# Patient Record
Sex: Male | Born: 1960
Health system: Southern US, Community
[De-identification: ages and names within clinical notes are randomized; demographics above are authoritative.]

## PROBLEM LIST (undated history)

## (undated) DIAGNOSIS — G4733 Obstructive sleep apnea (adult) (pediatric): Principal | ICD-10-CM

## (undated) DIAGNOSIS — I480 Paroxysmal atrial fibrillation: Secondary | ICD-10-CM

## (undated) DIAGNOSIS — I10 Essential (primary) hypertension: Secondary | ICD-10-CM

## (undated) DIAGNOSIS — M25569 Pain in unspecified knee: Secondary | ICD-10-CM

## (undated) DIAGNOSIS — Z87898 Personal history of other specified conditions: Secondary | ICD-10-CM

## (undated) DIAGNOSIS — R0602 Shortness of breath: Secondary | ICD-10-CM

## (undated) DIAGNOSIS — Z8739 Personal history of other diseases of the musculoskeletal system and connective tissue: Secondary | ICD-10-CM

## (undated) DIAGNOSIS — M25519 Pain in unspecified shoulder: Secondary | ICD-10-CM

## (undated) DIAGNOSIS — Z9989 Dependence on other enabling machines and devices: Principal | ICD-10-CM

## (undated) DIAGNOSIS — I4891 Unspecified atrial fibrillation: Secondary | ICD-10-CM

## (undated) DIAGNOSIS — R002 Palpitations: Secondary | ICD-10-CM

## (undated) HISTORY — DX: Shortness of breath: R06.02

## (undated) HISTORY — DX: Personal history of other diseases of the musculoskeletal system and connective tissue: Z87.39

## (undated) HISTORY — DX: Pain in unspecified knee: M25.569

## (undated) HISTORY — DX: Morbid (severe) obesity due to excess calories: E66.01

## (undated) HISTORY — DX: Palpitations: R00.2

## (undated) HISTORY — DX: Pain in unspecified shoulder: M25.519

## (undated) HISTORY — DX: Dependence on other enabling machines and devices: Z99.89

## (undated) HISTORY — DX: Unspecified atrial fibrillation: I48.91

## (undated) HISTORY — DX: Paroxysmal atrial fibrillation: I48.0

## (undated) HISTORY — DX: Obstructive sleep apnea (adult) (pediatric): G47.33

## (undated) HISTORY — DX: Personal history of other specified conditions: Z87.898

---

## 1998-05-19 ENCOUNTER — Encounter: Payer: Self-pay | Admitting: Emergency Medicine

## 1998-05-19 ENCOUNTER — Emergency Department (HOSPITAL_COMMUNITY): Admission: EM | Admit: 1998-05-19 | Discharge: 1998-05-19 | Payer: Self-pay | Admitting: Emergency Medicine

## 2009-09-09 ENCOUNTER — Ambulatory Visit: Payer: Self-pay | Admitting: Sports Medicine

## 2009-09-09 DIAGNOSIS — M25569 Pain in unspecified knee: Secondary | ICD-10-CM | POA: Insufficient documentation

## 2009-09-09 HISTORY — DX: Pain in unspecified knee: M25.569

## 2009-09-23 ENCOUNTER — Encounter: Admission: RE | Admit: 2009-09-23 | Discharge: 2009-09-23 | Payer: Self-pay | Admitting: Sports Medicine

## 2010-10-07 NOTE — Assessment & Plan Note (Signed)
Summary: RT KNEE PAIN,MC   Vital Signs:  Patient profile:   50 year old male Height:      71 inches Weight:      280 pounds BMI:     39.19 BP sitting:   148 / 102  Vitals Entered By: Lillia Pauls CMA (September 09, 2009 11:30 AM)  History of Present Illness: Pt presents today with complaint of Right Medial Knee pain.  He states he injuried this knee 30 years ago in a football injury that occured from a lateral blow to the knee.  He is unsure of the exact injury at that time but attributes the pain he is currently having this event.  Currently his knee has been bothering him on and off for approximatly 18 months ever since he was [place] the [place] with his [place].  He describes the pain as an aching pain located along the medial joint line.  He reports one episode of giving out during a basketball game this summer; no locking, clicking or grinding.  He has tried asprin and Tylenol with no relief and has had no formal PT but reports using an exercise bike and a regular routine of weight lifting. Currently he has limited only his wt bearing exercise but has had no impact on his day to day life.  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   Msk:  R knee:  no joint swelling, no joint warmth, no joint deformities, no joint instability, and no crepitation.  ROM: limited by 10 degrees of flexion Tenderness with lateral stress along the medial joint line; no instability.    stable ligaments; negative Mcmurray's and provocative meniscal tests; non painful patellar compression; patellar and quadriceps tendons unremarkable.  neg thessaly neg flex pinch    Impression & Recommendations:  Problem # 1:  KNEE PAIN ([id number]-469.62)  Orders: Radiology other (Radiology Other)  RT knee meidal joint is tender enough to suspect some OCD or  possible early med compt DJD  will ck standing knee films and compare no clinical sign to suggest meniscus is primary but may have some degenerative meniscal change  use arch supports to lessen medial stress consider tramadol in future if pain severe trial on devils claw  consider don joy support if not better with cons care cont on biking for rehab  reck in 2 mos  Complete Medication List: 1)  Clonazepam 1 Mg Tabs (Clonazepam) .Marland Kitchen.. 1 mg by mouth at bedtime

## 2012-03-02 ENCOUNTER — Encounter (HOSPITAL_COMMUNITY): Payer: Self-pay | Admitting: Adult Health

## 2012-03-02 ENCOUNTER — Emergency Department (HOSPITAL_COMMUNITY)
Admission: EM | Admit: 2012-03-02 | Discharge: 2012-03-02 | Disposition: A | Discharge status: Home / Self Care | Payer: BC Managed Care – PPO | Attending: Emergency Medicine | Admitting: Emergency Medicine

## 2012-03-02 ENCOUNTER — Emergency Department (HOSPITAL_COMMUNITY): Payer: BC Managed Care – PPO

## 2012-03-02 DIAGNOSIS — I10 Essential (primary) hypertension: Secondary | ICD-10-CM | POA: Insufficient documentation

## 2012-03-02 DIAGNOSIS — Y9301 Activity, walking, marching and hiking: Secondary | ICD-10-CM | POA: Insufficient documentation

## 2012-03-02 DIAGNOSIS — Y998 Other external cause status: Secondary | ICD-10-CM | POA: Insufficient documentation

## 2012-03-02 DIAGNOSIS — W01119A Fall on same level from slipping, tripping and stumbling with subsequent striking against unspecified sharp object, initial encounter: Secondary | ICD-10-CM | POA: Insufficient documentation

## 2012-03-02 DIAGNOSIS — W268XXA Contact with other sharp object(s), not elsewhere classified, initial encounter: Secondary | ICD-10-CM | POA: Insufficient documentation

## 2012-03-02 DIAGNOSIS — S81819A Laceration without foreign body, unspecified lower leg, initial encounter: Secondary | ICD-10-CM

## 2012-03-02 DIAGNOSIS — S81009A Unspecified open wound, unspecified knee, initial encounter: Secondary | ICD-10-CM | POA: Insufficient documentation

## 2012-03-02 HISTORY — DX: Essential (primary) hypertension: I10

## 2012-03-02 MED ORDER — "THROMBI-PAD 3""X3"" EX PADS"
MEDICATED_PAD | CUTANEOUS | Status: AC
  Filled 2012-03-02: qty 1

## 2012-03-02 MED ORDER — TETANUS-DIPHTH-ACELL PERTUSSIS 5-2.5-18.5 LF-MCG/0.5 IM SUSP
0.5000 mL | Freq: Once | INTRAMUSCULAR | Status: AC
  Administered 2012-03-02: 0.5 mL via INTRAMUSCULAR
  Filled 2012-03-02: qty 0.5

## 2012-03-02 NOTE — ED Notes (Addendum)
Reports flag pole that broke and cut into left lower leg. Pt states it happened earlier this evening at 9pm and it has not stopped bleeding since. Pressure applied to wound. CMS intact.

## 2012-03-02 NOTE — ED Notes (Signed)
Pt discharged home without any bleeding present with spouse.

## 2012-03-02 NOTE — ED Notes (Signed)
Suture cart placed at bedside. 

## 2012-03-02 NOTE — ED Provider Notes (Signed)
History     CSN: [id number]  Arrival date & time 03/02/12  0150   First MD Initiated Contact with Patient 03/02/12 0226      Chief Complaint  Patient presents with  . Laceration    (Consider location/radiation/quality/duration/timing/severity/associated sxs/prior treatment) Patient is a 51 y.o. male presenting with skin laceration. The history is provided by the patient.  Laceration  The incident occurred 6 to 12 hours ago.   patient states that he was carrying some heavy bags and fell. He grabbed on to a metal flagpole the broken cut into his left lower leg. States that he has not been able to have the bleeding stopped. No other injury. No numbness weakness. His last tetanus was in the 80s. No numbness or weakness.  Past Medical History  Diagnosis Date  . Hypertension     No past surgical history on file.  No family history on file.  History  Substance Use Topics  . Smoking status: Never Smoker   . Smokeless tobacco: Not on file  . Alcohol Use: No      Review of Systems  Musculoskeletal: Negative for myalgias.  Skin: Positive for wound.  Neurological: Negative for weakness and numbness.    Allergies  Review of patient's allergies indicates no known allergies.  Home Medications   Current Outpatient Rx  Name Route Sig Dispense Refill  . CLONAZEPAM 2 MG PO TABS Oral Take 2 mg by mouth at bedtime.    Marland Kitchen LOSARTAN POTASSIUM 100 MG PO TABS Oral Take 100 mg by mouth daily.      BP 135/102  Pulse 83  Temp 97.9 F (36.6 C) (Oral)  Resp 16  SpO2 97%  Physical Exam  Constitutional: He appears well-developed and well-nourished.  Musculoskeletal:       4 cm U-shaped laceration to left lower anterior leg. It is lateral to the tibia on the lower third of the lower leg. Neurovascular intact distally. Strong dorsalis pedis pulse. Sensation intact over her foot.  Neurological: He is alert.  Skin: Skin is warm.    ED Course  Procedures (including critical care  time)  Labs Reviewed - No data to display Dg Tibia/fibula Left  03/02/2012  *RADIOLOGY REPORT*  Clinical Data: Laceration to the anterior distal lower leg with metal object.  LEFT TIBIA AND FIBULA - 2 VIEW  Comparison: Left knee 09/23/2009  Findings: Mild degenerative changes in the left knee.  Focal skin defect consistent with laceration and avulsion anterior to the distal tibia.  Multiple tiny radiopaque densities are demonstrated over the area of soft tissue injury.  The appearance is more likely to represent punctate calcification or debris.  No metallic foreign bodies are demonstrated.  Overlying gauze material was present. Underlying bones appear intact.  No evidence of acute fracture or subluxation.  No focal bone lesion or bone destruction.  No abnormal periosteal reaction.  IMPRESSION: Soft tissue avulsion anterior to the distal tibia.  Multiple punctate radiopaque densities in the region of the soft tissue defect most consistent with debris or calcification.  No metallic foreign bodies are demonstrated.  No evidence of osteomyelitis or fracture.  Original Report Authenticated By: Marlon Pel, M.D.     1. Laceration of lower leg     LACERATION REPAIR Performed by: Billee Cashing Authorized by: Billee Cashing Consent: Verbal consent obtained. Risks and benefits: risks, benefits and alternatives were discussed Consent given by: patient Patient identity confirmed: provided demographic data Prepped and Draped in normal sterile fashion Wound explored  Laceration Location: left lower leg  Laceration Length: 4cm  Small foreign bodies seen and removed with skin scrub  Anesthesia: local infiltration  Local anesthetic: lidocaine 2% with epinephrine  Anesthetic total: 5 ml  Irrigation method: syringe Amount of cleaning: standard  Skin closure: 4-0 prolene  Number of sutures: 9  Technique: simple interupted  Patient tolerance: Patient tolerated the procedure  well with no immediate complications.  MDM  Laceration to left lower leg. Foreign bodies seen or removed. Sutured. Patient was discharged home.         Juliet Rude. Rubin Payor, MD 03/02/12 [phone]

## 2012-03-02 NOTE — Discharge Instructions (Signed)

## 2017-04-27 ENCOUNTER — Other Ambulatory Visit: Payer: Self-pay | Admitting: Internal Medicine

## 2017-04-27 DIAGNOSIS — R0600 Dyspnea, unspecified: Secondary | ICD-10-CM

## 2017-04-28 ENCOUNTER — Encounter (HOSPITAL_COMMUNITY): Payer: Self-pay | Admitting: Nurse Practitioner

## 2017-04-28 ENCOUNTER — Ambulatory Visit (HOSPITAL_COMMUNITY)
Admission: RE | Admit: 2017-04-28 | Discharge: 2017-04-28 | Disposition: A | Discharge status: Home / Self Care | Payer: Managed Care, Other (non HMO) | Source: Ambulatory Visit | Attending: Nurse Practitioner | Admitting: Nurse Practitioner

## 2017-04-28 ENCOUNTER — Other Ambulatory Visit: Payer: Self-pay

## 2017-04-28 ENCOUNTER — Ambulatory Visit: Payer: Managed Care, Other (non HMO)

## 2017-04-28 VITALS — BP 136/74 | HR 161 | Ht 71.0 in | Wt 317.0 lb

## 2017-04-28 DIAGNOSIS — Z7289 Other problems related to lifestyle: Secondary | ICD-10-CM | POA: Insufficient documentation

## 2017-04-28 DIAGNOSIS — I10 Essential (primary) hypertension: Secondary | ICD-10-CM | POA: Insufficient documentation

## 2017-04-28 DIAGNOSIS — Z79899 Other long term (current) drug therapy: Secondary | ICD-10-CM | POA: Insufficient documentation

## 2017-04-28 DIAGNOSIS — I4891 Unspecified atrial fibrillation: Secondary | ICD-10-CM | POA: Diagnosis not present

## 2017-04-28 DIAGNOSIS — I480 Paroxysmal atrial fibrillation: Secondary | ICD-10-CM | POA: Diagnosis not present

## 2017-04-28 MED ORDER — DILTIAZEM HCL ER COATED BEADS 240 MG PO CP24: 240.0000 mg | ORAL_CAPSULE | Freq: Every day | ORAL | 3 refills | Status: DC

## 2017-04-28 NOTE — Progress Notes (Signed)
Primary Care Physician: Lorenda Ishihara, MD Referring Physician: Dr. Rexanne Mano [place] is a 56 y.o. male with a h/o morbid obesity, HTN that presented to the [facility] office for a stress test and was found to be in afib at 160 bpm. His PCP had ordered because the pt had noted intermittent shortness of breath. He was aware of a fast heart today rate but stable otherwise. He was asked to  come to afib clinic for further treatment. He remains in afib at 160 bpm currently. Chadsvasc score of 1 for HTN.  He reports that he lost his job at [employer] this past May. This has caused a lot of stress. Because he is sitting more at the computer looking for a job, he has gained 25 lbs. He does snore heavily. No tobacco, or alcohol, mod caffeine use. He will lose his insurance 8/31. He had an echo/stress test years ago.  Today, he denies symptoms of  chest pain, shortness of breath, orthopnea, PND, lower extremity edema, dizziness, presyncope, syncope, or neurologic sequela. The patient is tolerating medications without difficulties and is otherwise without complaint today.   Past Medical History:  Diagnosis Date  . Hypertension    No past surgical history on file.  Current Outpatient Prescriptions  Medication Sig Dispense Refill  . clonazePAM (KLONOPIN) 2 MG tablet Take 2 mg by mouth at bedtime.    Marland Kitchen losartan (COZAAR) 100 MG tablet Take 100 mg by mouth daily.    . niacin 500 MG tablet Take 500 mg by mouth at bedtime.    Marland Kitchen testosterone cypionate (DEPOTESTOSTERONE CYPIONATE) 200 MG/ML injection Inject into the muscle every 14 (fourteen) days. .75 cc    . diltiazem (CARTIA XT) 240 MG 24 hr capsule Take 1 capsule (240 mg total) by mouth daily. 30 capsule 3   No current facility-administered medications for this encounter.     No Known Allergies  Social History   Social History  . Marital status: Married    Spouse name: N/A  . Number of children: N/A  . Years of  education: N/A   Occupational History  . Not on file.   Social History Main Topics  . Smoking status: Never Smoker  . Smokeless tobacco: Never Used  . Alcohol use No  . Drug use: Unknown  . Sexual activity: Not on file   Other Topics Concern  . Not on file   Social History Narrative  . No narrative on file    No family history on file.  ROS- All systems are reviewed and negative except as per the HPI above  Physical Exam: Vitals:   04/28/17 1402  BP: 136/74  Pulse: (!) 161  Weight: (!) 317 lb (143.8 kg)  Height: 5\' 11"  (1.803 m)   Wt Readings from Last 3 Encounters:  04/28/17 (!) 317 lb (143.8 kg)  09/09/09 (!) 280 lb (127 kg)    Labs: No results found for: NA, K, CL, CO2, GLUCOSE, BUN, CREATININE, CALCIUM, PHOS, MG No results found for: INR No results found for: CHOL, HDL, LDLCALC, TRIG   GEN- The patient is well appearing, alert and oriented x 3 today.   Head- normocephalic, atraumatic Eyes-  Sclera clear, conjunctiva pink Ears- hearing intact Oropharynx- clear Neck- supple, no JVP Lymph- no cervical lymphadenopathy Lungs- Clear to ausculation bilaterally, normal work of breathing Heart- Rapid irregular rate and rhythm, no murmurs, rubs or gallops, PMI not laterally displaced GI- soft, NT, ND, + BS Extremities-  no clubbing, cyanosis, or edema MS- no significant deformity or atrophy Skin- no rash or lesion Psych- euthymic mood, full affect Neuro- strength and sensation are intact  EKG-afib with rvr at 161 bpm, LAFB qrs int 80 ms, qtc 454 ms Epic records reviewed    Assessment and Plan: 1. Paroxysmal afib with rvr General education re afib He will stop amlodipine and add Cardizem 240 mg daily Current chadsvasc score of 1, will not start anticoagulation at this time, unless afib proves to be persistent  2. Lifestyle issues Probable sleep apnea Needs a sleep study but will not order at this  time as it can not be done before 8/31 when pt loses  insurance He needs to work on weight loss as his obesity may be driving sleep apnea as well as afib Moderate caffeine use, cut consumption in half   F/u Friday am and if is back in rhythm, can reschedule stress test, probably needs repeat echo as well  Lupita Leash C. [facility] [address] [place], [place] [address] [phone]

## 2017-04-28 NOTE — Patient Instructions (Signed)
STOP amlodipine START diltiazem 240 mg, take one capsule once a day  We will see you Friday

## 2017-04-30 ENCOUNTER — Other Ambulatory Visit: Payer: Self-pay

## 2017-04-30 ENCOUNTER — Ambulatory Visit (HOSPITAL_COMMUNITY)
Admission: RE | Admit: 2017-04-30 | Discharge: 2017-04-30 | Disposition: A | Discharge status: Home / Self Care | Payer: Managed Care, Other (non HMO) | Source: Ambulatory Visit | Attending: Nurse Practitioner | Admitting: Nurse Practitioner

## 2017-04-30 ENCOUNTER — Encounter (HOSPITAL_COMMUNITY): Payer: Self-pay | Admitting: Nurse Practitioner

## 2017-04-30 VITALS — BP 136/84 | HR 99 | Ht 71.0 in | Wt 313.8 lb

## 2017-04-30 DIAGNOSIS — R000 Tachycardia, unspecified: Secondary | ICD-10-CM | POA: Insufficient documentation

## 2017-04-30 DIAGNOSIS — I480 Paroxysmal atrial fibrillation: Secondary | ICD-10-CM | POA: Insufficient documentation

## 2017-04-30 DIAGNOSIS — I10 Essential (primary) hypertension: Secondary | ICD-10-CM | POA: Insufficient documentation

## 2017-04-30 DIAGNOSIS — R0602 Shortness of breath: Secondary | ICD-10-CM | POA: Insufficient documentation

## 2017-04-30 DIAGNOSIS — I491 Atrial premature depolarization: Secondary | ICD-10-CM | POA: Diagnosis not present

## 2017-04-30 DIAGNOSIS — I444 Left anterior fascicular block: Secondary | ICD-10-CM | POA: Insufficient documentation

## 2017-04-30 DIAGNOSIS — I517 Cardiomegaly: Secondary | ICD-10-CM | POA: Diagnosis not present

## 2017-04-30 NOTE — Patient Instructions (Signed)
Your echo is scheduled for Monday 05/03/2017  At 11:30 at [address]  [facility] 3rd floor.  Please arrive 15 mins prior to this appointment.   Your lexiscan stress test, Part 1 is scheduled for 05/03/2017 at 12:30.  NO Caffeine 12 hours prior to the test and nothing to eat or drink 3 hours prior to.  Your lexiscan stress test, Part 2 is scheduled for Wednsday 05/05/2017 at 12:45.  NO caffeine 12 hours prior to and nothing to eat or drink 3 hours prior to.    The staff at the [address] office will be contacting you with further instructions  You are to continue your cardizem the day of testing

## 2017-04-30 NOTE — Progress Notes (Signed)
Primary Care Physician: Lorenda Ishihara, MD Referring Physician: Dr. Rexanne Mano [place] is a 56 y.o. male with a h/o morbid obesity, HTN that presented to the [facility] office for a stress test and was found to be in afib at 160 bpm. His PCP had ordered because the pt had noted intermittent shortness of breath. He was aware of a fast heart today rate but stable otherwise. He was asked to  come to afib clinic for further treatment. He remains in afib at 160 bpm currently. Chadsvasc score of 1 for HTN.  He reports that he lost his job at [employer] this past May. This has caused a lot of stress. Because he is sitting more at the computer looking for a job, he has gained 25 lbs. He does snore heavily. No tobacco, or alcohol, mod caffeine use. He will lose his insurance 8/31. He had an echo/stress test years ago.  F/u's in afib clinic, 8/24, and he is back in SR with PAC's. He is tolerating Cardizem 240 mg qd.  That was started on Wednesday. He will be rescheduled for a stress test, but will need a lexi Myoview with diltiazem on board so as not to induce afib again. I will also add an echo, now with known afib.  Today, he denies symptoms of  chest pain, shortness of breath, orthopnea, PND, lower extremity edema, dizziness, presyncope, syncope, or neurologic sequela. The patient is tolerating medications without difficulties and is otherwise without complaint today.   Past Medical History:  Diagnosis Date  . Hypertension    No past surgical history on file.  Current Outpatient Prescriptions  Medication Sig Dispense Refill  . clonazePAM (KLONOPIN) 2 MG tablet Take 2 mg by mouth at bedtime.    Marland Kitchen diltiazem (CARTIA XT) 240 MG 24 hr capsule Take 1 capsule (240 mg total) by mouth daily. 30 capsule 3  . losartan (COZAAR) 100 MG tablet Take 100 mg by mouth daily.    . niacin 500 MG tablet Take 500 mg by mouth at bedtime.    Marland Kitchen testosterone cypionate (DEPOTESTOSTERONE  CYPIONATE) 200 MG/ML injection Inject into the muscle every 14 (fourteen) days. .75 cc     No current facility-administered medications for this encounter.     No Known Allergies  Social History   Social History  . Marital status: Married    Spouse name: N/A  . Number of children: N/A  . Years of education: N/A   Occupational History  . Not on file.   Social History Main Topics  . Smoking status: Never Smoker  . Smokeless tobacco: Never Used  . Alcohol use No  . Drug use: Unknown  . Sexual activity: Not on file   Other Topics Concern  . Not on file   Social History Narrative  . No narrative on file    No family history on file.  ROS- All systems are reviewed and negative except as per the HPI above  Physical Exam: Vitals:   04/30/17 1020  BP: 136/84  Pulse: 99  Weight: (!) 313 lb 12.8 oz (142.3 kg)  Height: 5\' 11"  (1.803 m)   Wt Readings from Last 3 Encounters:  04/30/17 (!) 313 lb 12.8 oz (142.3 kg)  04/28/17 (!) 317 lb (143.8 kg)  09/09/09 (!) 280 lb (127 kg)    Labs: No results found for: NA, K, CL, CO2, GLUCOSE, BUN, CREATININE, CALCIUM, PHOS, MG No results found for: INR No results found for: CHOL,  HDL, LDLCALC, TRIG   GEN- The patient is well appearing, alert and oriented x 3 today.   Head- normocephalic, atraumatic Eyes-  Sclera clear, conjunctiva pink Ears- hearing intact Oropharynx- clear Neck- supple, no JVP Lymph- no cervical lymphadenopathy Lungs- Clear to ausculation bilaterally, normal work of breathing Heart- Irregular(pac's) rate and rhythm, no murmurs, rubs or gallops, PMI not laterally displaced GI- soft, NT, ND, + BS Extremities- no clubbing, cyanosis, or edema MS- no significant deformity or atrophy Skin- no rash or lesion Psych- euthymic mood, full affect Neuro- strength and sensation are intact  EKG-SR with PAC's at 99 bpm, pr int 176 ms, qrs int 86 ms, qtc 459 ms Epic records reviewed    Assessment and Plan: 1.  Paroxysmal afib with rvr Now back in SR General education re afib Continue Cardizem 240 mg daily Current chadsvasc score of 1, will not start anticoagulation at this time, unless afib proves to be persistent  2. Lifestyle issues Probable sleep apnea Needs a sleep study but will not order at this  time as it can not be done before 8/31 when pt loses insurance He needs to work on weight loss as his obesity may be driving sleep apnea as well as afib Moderate caffeine use, cut consumption in half   Will reschedule stress test 2 day lexi with meds and will need echo as well  Lupita Leash C. [facility] [address] [place], [place] [address] [phone]

## 2017-05-03 ENCOUNTER — Ambulatory Visit (HOSPITAL_BASED_OUTPATIENT_CLINIC_OR_DEPARTMENT_OTHER): Payer: Managed Care, Other (non HMO)

## 2017-05-03 ENCOUNTER — Ambulatory Visit (HOSPITAL_COMMUNITY): Payer: Managed Care, Other (non HMO) | Attending: Cardiology

## 2017-05-03 ENCOUNTER — Other Ambulatory Visit: Payer: Self-pay

## 2017-05-03 DIAGNOSIS — I358 Other nonrheumatic aortic valve disorders: Secondary | ICD-10-CM | POA: Diagnosis not present

## 2017-05-03 DIAGNOSIS — R0602 Shortness of breath: Secondary | ICD-10-CM

## 2017-05-03 DIAGNOSIS — I480 Paroxysmal atrial fibrillation: Secondary | ICD-10-CM | POA: Insufficient documentation

## 2017-05-03 DIAGNOSIS — I119 Hypertensive heart disease without heart failure: Secondary | ICD-10-CM | POA: Insufficient documentation

## 2017-05-03 DIAGNOSIS — R079 Chest pain, unspecified: Secondary | ICD-10-CM | POA: Insufficient documentation

## 2017-05-03 DIAGNOSIS — I2510 Atherosclerotic heart disease of native coronary artery without angina pectoris: Secondary | ICD-10-CM | POA: Insufficient documentation

## 2017-05-03 MED ORDER — REGADENOSON 0.4 MG/5ML IV SOLN
0.4000 mg | Freq: Once | INTRAVENOUS | Status: AC
  Administered 2017-05-03: 0.4 mg via INTRAVENOUS

## 2017-05-03 MED ORDER — TECHNETIUM TC 99M TETROFOSMIN IV KIT
32.9000 | PACK | Freq: Once | INTRAVENOUS | Status: AC | PRN
  Administered 2017-05-03: 32.9 via INTRAVENOUS
  Filled 2017-05-03: qty 33

## 2017-05-04 ENCOUNTER — Other Ambulatory Visit (HOSPITAL_COMMUNITY): Payer: Self-pay | Admitting: *Deleted

## 2017-05-04 DIAGNOSIS — I8290 Acute embolism and thrombosis of unspecified vein: Secondary | ICD-10-CM

## 2017-05-04 MED ORDER — RIVAROXABAN 20 MG PO TABS: 20.0000 mg | ORAL_TABLET | Freq: Every day | ORAL | 0 refills | Status: DC

## 2017-05-05 ENCOUNTER — Ambulatory Visit (HOSPITAL_COMMUNITY): Payer: Managed Care, Other (non HMO)

## 2017-05-05 LAB — MYOCARDIAL PERFUSION IMAGING
LV dias vol: 123 mL (ref 62–150)
LV sys vol: 80 mL
Peak HR: 112 {beats}/min
RATE: 0.24
Rest HR: 95 {beats}/min
SDS: 1
SRS: 1
SSS: 2
TID: 0.98

## 2017-05-05 MED ORDER — TECHNETIUM TC 99M TETROFOSMIN IV KIT
30.6000 | PACK | Freq: Once | INTRAVENOUS | Status: AC | PRN
  Administered 2017-05-05: 30.6 via INTRAVENOUS
  Filled 2017-05-05: qty 31

## 2017-05-06 ENCOUNTER — Ambulatory Visit (HOSPITAL_COMMUNITY)
Admission: RE | Admit: 2017-05-06 | Discharge: 2017-05-06 | Disposition: A | Discharge status: Home / Self Care | Payer: Managed Care, Other (non HMO) | Source: Ambulatory Visit | Attending: Nurse Practitioner | Admitting: Nurse Practitioner

## 2017-05-06 DIAGNOSIS — K8020 Calculus of gallbladder without cholecystitis without obstruction: Secondary | ICD-10-CM | POA: Diagnosis not present

## 2017-05-06 DIAGNOSIS — I8290 Acute embolism and thrombosis of unspecified vein: Secondary | ICD-10-CM | POA: Insufficient documentation

## 2017-05-06 DIAGNOSIS — K760 Fatty (change of) liver, not elsewhere classified: Secondary | ICD-10-CM | POA: Diagnosis not present

## 2017-05-06 DIAGNOSIS — N2881 Hypertrophy of kidney: Secondary | ICD-10-CM | POA: Diagnosis not present

## 2017-05-06 DIAGNOSIS — N281 Cyst of kidney, acquired: Secondary | ICD-10-CM | POA: Diagnosis not present

## 2017-05-30 ENCOUNTER — Other Ambulatory Visit (HOSPITAL_COMMUNITY): Payer: Self-pay | Admitting: Nurse Practitioner

## 2017-06-04 ENCOUNTER — Ambulatory Visit (INDEPENDENT_AMBULATORY_CARE_PROVIDER_SITE_OTHER): Payer: Managed Care, Other (non HMO) | Admitting: Internal Medicine

## 2017-06-04 ENCOUNTER — Encounter (INDEPENDENT_AMBULATORY_CARE_PROVIDER_SITE_OTHER): Payer: Self-pay

## 2017-06-04 ENCOUNTER — Encounter: Payer: Self-pay | Admitting: Internal Medicine

## 2017-06-04 VITALS — BP 148/104 | HR 90 | Ht 71.0 in | Wt 303.0 lb

## 2017-06-04 DIAGNOSIS — I480 Paroxysmal atrial fibrillation: Secondary | ICD-10-CM | POA: Diagnosis not present

## 2017-06-04 DIAGNOSIS — R0602 Shortness of breath: Secondary | ICD-10-CM | POA: Diagnosis not present

## 2017-06-04 DIAGNOSIS — I10 Essential (primary) hypertension: Secondary | ICD-10-CM

## 2017-06-04 DIAGNOSIS — E782 Mixed hyperlipidemia: Secondary | ICD-10-CM | POA: Diagnosis not present

## 2017-06-04 MED ORDER — TRIAMTERENE-HCTZ 37.5-25 MG PO TABS: 0.5000 | ORAL_TABLET | Freq: Every day | ORAL | 3 refills | Status: DC

## 2017-06-04 NOTE — Patient Instructions (Addendum)
Your physician has recommended you make the following change in your medication:  1.) triamterene/hctz (Maxzide) 37.5/25--take HALF TABLET ONCE A DAY  Your physician has recommended that you have a sleep study. This test records several body functions during sleep, including: brain activity, eye movement, oxygen and carbon dioxide blood levels, heart rate and rhythm, breathing rate and rhythm, the flow of air through your mouth and nose, snoring, body muscle movements, and chest and belly movement.    Your physician recommends that you schedule a follow-up appointment in: 6-8 weeks with Dr. Tenny Craw.  See below.

## 2017-06-04 NOTE — Progress Notes (Signed)
Cardiology Office Note   Date:  06/04/2017   ID:  Michael Mcgee, DOB [birth year], MRN [id number]  PCP:  Lorenda Ishihara, MD  Cardiologist:   Dietrich Pates, MD   Patient referred by dr Chales Salmon for SOB       History of Present Illness: Michael Mcgee is a 56 y.o. male with no known hsitory of CAD   Seen by Dr Chales Salmon on 8/20  COmplained of SOB  Progressive   No CP  No dizziness  No palpitations    He was set up for stress test  Found to be in atrial fibrillatoin at time  Set up to see D Carroll  Back in SR   Placed on anticoag and diltiazem   He also had an echo done that showed severe LVH   LVEF normal     Since seen he denies palpitations  Breathing is fair  No CP    Hx of HL with Trig 367  HDL 26  LDL 81   Told he had sleep apnea in past   Also hsitory of HTN  BP at home report 140 to 160  Weight lifts high wt  Low rep  Hx low testosterone  Wants to use IM testosterone again         No outpatient prescriptions have been marked as taking for the 06/04/17 encounter (Office Visit) with Pricilla Riffle, MD.     Allergies:   Patient has no known allergies.   Past Medical History:  Diagnosis Date  . A-fib (HCC)   . H/O dislocation of shoulder   . History of morbid obesity   . Hypertension   . KNEE PAIN 09/09/2009   Qualifier: Diagnosis of  By: Christell Constant CMA, [place]    . Palpitations   . Shoulder pain   . SOB (shortness of breath)     History reviewed. No pertinent surgical history.   Social History:  The patient  reports that he has never smoked. He has never used smokeless tobacco. He reports that he does not drink alcohol.   Family History:  The patient's family history includes Healthy in his son.    ROS:  Please see the history of present illness. All other systems are reviewed and  Negative to the above problem except as noted.    PHYSICAL EXAM: VS:  BP (!) 148/104   Pulse 90   Ht  (1.803 m)   Wt (!) 303 lb (137.4 kg)   SpO2 96%   BMI  42.26 kg/m   GEN: Morbidly obese 56 yo , in no acute distress  HEENT: normal  Neck: no JVD, carotid bruits, or masses Cardiac: RRR; no murmurs, rubs, or gallops,no edema  Respiratory:  clear to auscultation bilaterally, normal work of breathing GI: soft, nontender, nondistended, + BS  No hepatomegaly  MS: no deformity Moving all extremities   Skin: warm and dry, no rash Neuro:  Strength and sensation are intact Psych: euthymic mood, full affect   EKG:  EKG is ordered today. On 8/24:  SR with PACs  LAD  LVH with repolarization abnormality     Lipid Panel No results found for: CHOL, TRIG, HDL, CHOLHDL, VLDL, LDLCALC, LDLDIRECT    Wt Readings from Last 3 Encounters:  06/04/17 (!) 303 lb (137.4 kg)  05/03/17 (!) 313 lb (142 kg)  04/30/17 (!) 313 lb 12.8 oz (142.3 kg)      ASSESSMENT AND PLAN:  1  PAF  Remains in SR  This prob explains SOB   Would keep on dilt and anticoagulation  2  HTN  Not controlled  I would recomm 1/2 maxzide 37/5/25 qd  Follow up in clinic  3  ? Sleep apnea Needs to have repeat study with HTN and PAF  4  HL  Discussed carbs     Encouraged him to stay active aerobically  I do not recomm conitniuing high resistence wts  This only increases afterload   Do higher rep lower wt.    Will check on testosterone but with HTN lean against     Current medicines are reviewed at length with the patient today.  The patient does not have concerns regarding medicines.  Signed, Dietrich Pates, MD  06/04/2017 10:36 AM    [facility] [address] [place], [place], [place]  [address] Phone: [phone]; Fax: [phone]

## 2017-07-09 ENCOUNTER — Encounter: Payer: Self-pay | Admitting: Internal Medicine

## 2017-07-22 ENCOUNTER — Encounter: Payer: Self-pay | Admitting: Internal Medicine

## 2017-07-22 ENCOUNTER — Ambulatory Visit (INDEPENDENT_AMBULATORY_CARE_PROVIDER_SITE_OTHER): Payer: Managed Care, Other (non HMO) | Admitting: Internal Medicine

## 2017-07-22 ENCOUNTER — Encounter (INDEPENDENT_AMBULATORY_CARE_PROVIDER_SITE_OTHER): Payer: Self-pay

## 2017-07-22 VITALS — BP 130/92 | HR 91 | Ht 71.0 in | Wt 314.8 lb

## 2017-07-22 DIAGNOSIS — I480 Paroxysmal atrial fibrillation: Secondary | ICD-10-CM

## 2017-07-22 DIAGNOSIS — E782 Mixed hyperlipidemia: Secondary | ICD-10-CM | POA: Diagnosis not present

## 2017-07-22 DIAGNOSIS — I10 Essential (primary) hypertension: Secondary | ICD-10-CM

## 2017-07-22 LAB — BASIC METABOLIC PANEL
BUN/Creatinine Ratio: 16 (ref 9–20)
BUN: 15 mg/dL (ref 6–24)
CO2: 22 mmol/L (ref 20–29)
Calcium: 8.7 mg/dL (ref 8.7–10.2)
Chloride: 107 mmol/L — ABNORMAL HIGH (ref 96–106)
Creatinine, Ser: 0.94 mg/dL (ref 0.76–1.27)
GFR calc Af Amer: 104 mL/min/{1.73_m2} (ref >59)
GFR calc non Af Amer: 90 mL/min/{1.73_m2} (ref >59)
Glucose: 108 mg/dL — ABNORMAL HIGH (ref 65–99)
Potassium: 3.9 mmol/L (ref 3.5–5.2)
Sodium: 143 mmol/L (ref 134–144)

## 2017-07-22 LAB — CBC
Hematocrit: 45.9 % (ref 37.5–51.0)
Hemoglobin: 16.1 g/dL (ref 13.0–17.7)
MCH: 31 pg (ref 26.6–33.0)
MCHC: 35.1 g/dL (ref 31.5–35.7)
MCV: 88 fL (ref 79–97)
Platelets: 210 10*3/uL (ref 150–379)
RBC: 5.2 x10E6/uL (ref 4.14–5.80)
RDW: 16.4 % — ABNORMAL HIGH (ref 12.3–15.4)
WBC: 6.9 10*3/uL (ref 3.4–10.8)

## 2017-07-22 MED ORDER — LOSARTAN POTASSIUM 100 MG PO TABS: 100.0000 mg | ORAL_TABLET | Freq: Every day | ORAL | 3 refills | Status: AC

## 2017-07-22 MED ORDER — DILTIAZEM HCL ER COATED BEADS 240 MG PO CP24: 240.0000 mg | ORAL_CAPSULE | Freq: Every day | ORAL | 3 refills | Status: DC

## 2017-07-22 MED ORDER — TRIAMTERENE-HCTZ 37.5-25 MG PO TABS: 1.0000 | ORAL_TABLET | Freq: Every day | ORAL | 3 refills | Status: DC

## 2017-07-22 NOTE — Progress Notes (Signed)
Cardiology Office Note   Date:  07/22/2017   ID:  Michael SpatesMark L Muraoka, DOB 12/04/1960, MRN [id number]  PCP:  Lorenda IshiharaVaradarajan, Rupashree, MD  Cardiologist:   Dietrich PatesPaula Meira Wahba, MD   F/u of HTN and atrial fib      History of Present Illness: Michael Mcgee is a 56 y.o. male with no known hsitory of CAD   Seen by Dr Chales SalmonVaradarajan on 8/20  COmplained of SOB  Progressive   No CP  No dizziness  No palpitations    He was set up for stress test  Found to be in atrial fibrillatoin at time  Set up to see D Carroll  Back in SR   Placed on anticoag and diltiazem   He also had an echo done that showed severe LVH   LVEF normal      Hx of HL with Trig 367  HDL 26  LDL 81   Told he had sleep apnea in past   Also hsitory of HTN  BP at home report 140 to 160  I saw the pt in Sept   BP high  Added maxzide  He has increased to 1 tab  Breathng is OK  NO CP  No palpitations       Current Meds  Medication Sig  . clonazePAM (KLONOPIN) 2 MG tablet Take 2 mg by mouth at bedtime.  Marland Kitchen. diltiazem (CARTIA XT) 240 MG 24 hr capsule Take 1 capsule (240 mg total) by mouth daily.  Marland Kitchen. losartan (COZAAR) 100 MG tablet Take 100 mg by mouth daily.  . niacin 500 MG tablet Take 500 mg by mouth at bedtime.  Marland Kitchen. testosterone cypionate (DEPOTESTOSTERONE CYPIONATE) 200 MG/ML injection Inject into the muscle every 14 (fourteen) days. .75 cc  . triamterene-hydrochlorothiazide (MAXZIDE-25) 37.5-25 MG tablet Take 0.5 tablets by mouth daily.  [name] XARELTO 20 MG TABS tablet TAKE 1 TABLET BY MOUTH EVERY DAY WITH SUPPER     Allergies:   Patient has no known allergies.   Past Medical History:  Diagnosis Date  . A-fib (HCC)   . H/O dislocation of shoulder   . History of morbid obesity   . Hypertension   . KNEE PAIN 09/09/2009   Qualifier: Diagnosis of  By: Christell ConstantMoore CMA, [place]    . Palpitations   . Shoulder pain   . SOB (shortness of breath)     No past surgical history on file.   Social History:  The patient  reports that  has never  smoked. he has never used smokeless tobacco. He reports that he does not drink alcohol or use drugs.   Family History:  The patient's family history includes Healthy in his son.    ROS:  Please see the history of present illness. All other systems are reviewed and  Negative to the above problem except as noted.    PHYSICAL EXAM: VS:  BP (!) 130/92   Pulse 91   Ht 5\' 11"  (1.803 m)   Wt (!) 314 lb 12.8 oz (142.8 kg)   SpO2 94%   BMI 43.91 kg/m   GEN: Morbidly obese 56 yo , in no acute distress  HEENT: normal  Neck: JVP normal  No , carotid bruits, or masses Cardiac: RRR; no murmurs, rubs, or gallops,no edema  Respiratory:  clear to auscultation bilaterally, normal work of breathing GI: soft, nontender, nondistended, + BS  No hepatomegaly  MS: no deformity Moving all extremities   Skin: warm and dry, no rash Neuro:  Strength  and sensation are intact Psych: euthymic mood, full affect   EKG:  EKG is ordered today. On 8/24:  SR with PACs  LAD  LVH with repolarization abnormality     Lipid Panel No results found for: CHOL, TRIG, HDL, CHOLHDL, VLDL, LDLCALC, LDLDIRECT    Wt Readings from Last 3 Encounters:  07/22/17 (!) 314 lb 12.8 oz (142.8 kg)  06/04/17 (!) 303 lb (137.4 kg)  05/03/17 (!) 313 lb (142 kg)      ASSESSMENT AND PLAN:  1  PAF  Remains in SR  Continue on dilt and anticoagulation    2  HTN  He is better but still not controlled  Needs  Sleep study  WIlll check BMET    3  ? Sleep apnea  Order study    4  HL Will follow for now  F?U in Feb     Current medicines are reviewed at length with the patient today.  The patient does not have concerns regarding medicines.  Signed, Dietrich PatesPaula Merilyn Pagan, MD  07/22/2017 9:01 AM    [facility] [address], [place], [place]  [address] Phone: [phone]; Fax: [phone]

## 2017-07-22 NOTE — Patient Instructions (Signed)
Medication Instructions:  Your physician recommends that you continue on your current medications as directed. Please refer to the Current Medication list given to you today.   Labwork: Your physician recommends that you return for lab work today (bmet, cbc)   Testing/Procedures: Dr. Tenny Crawoss recommends you have a SLEEP STUDY.  Follow-Up: Please schedule a follow up appointment in EARLY MARCH with Dr. Tenny Crawoss.  Any Other Special Instructions Will Be Listed Below (If Applicable).     If you need a refill on your cardiac medications before your next appointment, please call your pharmacy.

## 2017-07-23 ENCOUNTER — Telehealth: Payer: Self-pay | Admitting: *Deleted

## 2017-07-23 NOTE — Telephone Encounter (Signed)
Sent to sleep study pool. Referred by Dr Tenny Crawoss

## 2017-07-26 NOTE — Telephone Encounter (Signed)
Spoke to the patient and informed him that his sleep study has been sent to the sleep study pool for pre-certing and once a determination has been made by his insurance we will be back in contact with him. Patient agrees with treatment and thanked me for calling.

## 2017-09-03 ENCOUNTER — Telehealth: Payer: Self-pay | Admitting: *Deleted

## 2017-09-03 NOTE — Telephone Encounter (Signed)
Informed patient of upcoming home sleep study and patient understanding was verbalized. Patient understands his sleep study will be done at HOME with [facility]. Patient understands he will receive a call in a week or so. Patient understands to call if he does not receive the call in a timely manner. Patient understands he will be contacted by [facility] to set up his cpap. He understands to call if [facility] does not contact him with new setup in a timely manner. He understands he will be called once confirmation has been received from [facility] that he has received his new machine to schedule 10 week follow up appointment.  [facility] notified of new cpap order  Please add to [facility] He was grateful for the call and thanked me.

## 2017-09-03 NOTE — Telephone Encounter (Signed)
-----   Message from Haywood FillerAmy C Higgins sent at 09/02/2017 11:50 AM EST ----- Regarding: FW: Okay to switch to home sleep study? Per Dr. Tenny Crawoss, okay to switch pt to a home study. Thanks, Amy ----- Message ----- From: Pricilla Riffleoss, Paula V, MD Sent: 09/01/2017  11:47 PM To: Amy Pamella Pert Higgins Subject: RE: Molli KnockOkay to switch to home sleep study?        OK to switch ----- Message ----- From: Haywood FillerHiggins, Amy C Sent: 09/01/2017   1:33 PM To: Pricilla RifflePaula Ross V, MD Subject: Molli KnockOkay to switch to home sleep study?            Insurance did not approve patient for an in lab sleep study. Is it okay to switch patient to a home sleep study? Thank you, Amy

## 2017-09-10 ENCOUNTER — Telehealth: Payer: Self-pay | Admitting: Internal Medicine

## 2017-09-10 NOTE — Telephone Encounter (Signed)
New Message  Annice PihJackie from [facility] call requesting to speak with RN. She states pt has an approval on file for a home sleep study. She would like to discuss who the sleep study was with. Please call back to discuss

## 2017-09-13 NOTE — Telephone Encounter (Signed)
Will route to Sleep Assistant for follow up.

## 2017-09-14 NOTE — Telephone Encounter (Signed)
Follow Up    [facility] calling [phone] Annice PihJackie from [facility] is calling back to verify if the order from Dr Tenny Crawoss needs to canceled. Has patient had sleep study done elsewhere already. Please call

## 2017-09-20 NOTE — Telephone Encounter (Signed)
Per Annice PihJackie @[facility] : Patient has been scheduled for sleep study, authorization has been given to [facility] and the test has delivered.

## 2017-10-28 ENCOUNTER — Telehealth: Payer: Self-pay | Admitting: *Deleted

## 2017-10-28 DIAGNOSIS — G4733 Obstructive sleep apnea (adult) (pediatric): Secondary | ICD-10-CM

## 2017-10-28 NOTE — Telephone Encounter (Signed)
Informed patient of sleep study results and patient understanding was verbalized. Patient understands his sleep study showed he has moderate to severe OSA with an AHI of 61.9. Patient understands Dr Mayford Knifeurner recommends him for in lab CPAP titration. Patient states he CAN NOT go into the lab for this test and wonders if there is a titration test he can take at home just like the home sleep test. Patient says he knows he will not be able to tolerate being in that lab. Please advise

## 2017-10-28 NOTE — Telephone Encounter (Signed)
-----   Message from Quintella Reichertraci R Turner, MD sent at 10/27/2017  7:23 PM EST ----- Please let patient know that he has moderate to severe OSA and set up for in lab CPAP titration

## 2017-10-28 NOTE — Telephone Encounter (Signed)
He has severe OSA and therefore it is recommended that he have an in lab study because it will be difficult to do with a home test

## 2017-10-29 NOTE — Addendum Note (Signed)
Addended by: Reesa ChewJONES, Vernell Back G on: 10/29/2017 03:12 PM   Modules accepted: Orders

## 2017-10-29 NOTE — Telephone Encounter (Signed)
Patient he will give it a try and gave the ok to schedule his in lab CPAP titration.

## 2017-11-08 ENCOUNTER — Encounter: Payer: Self-pay | Admitting: Internal Medicine

## 2017-11-08 ENCOUNTER — Ambulatory Visit: Payer: Managed Care, Other (non HMO) | Admitting: Internal Medicine

## 2017-11-08 VITALS — BP 138/102 | HR 102 | Ht 71.0 in | Wt 316.0 lb

## 2017-11-08 DIAGNOSIS — I10 Essential (primary) hypertension: Secondary | ICD-10-CM | POA: Diagnosis not present

## 2017-11-08 DIAGNOSIS — E781 Pure hyperglyceridemia: Secondary | ICD-10-CM | POA: Diagnosis not present

## 2017-11-08 DIAGNOSIS — I480 Paroxysmal atrial fibrillation: Secondary | ICD-10-CM

## 2017-11-08 DIAGNOSIS — G4730 Sleep apnea, unspecified: Secondary | ICD-10-CM

## 2017-11-08 MED ORDER — DILTIAZEM HCL ER COATED BEADS 360 MG PO CP24: 360.0000 mg | ORAL_CAPSULE | Freq: Every day | ORAL | 3 refills | Status: DC

## 2017-11-08 NOTE — Progress Notes (Signed)
Cardiology Office Note   Date:  11/08/2017   ID:  Michael SpatesMark L Petrovich, DOB 12/22/1960, MRN [id number]  PCP:  Lorenda IshiharaVaradarajan, Rupashree, MD  Cardiologist:   Dietrich PatesPaula Teri Legacy, MD   F/u of HTN and atrial fib      History of Present Illness: Michael Mcgee is a 57 y.o. male with no known CAD  Does have a history of HTN, HL Told he had sleep apnea in past  Seen by Dr Chales SalmonVaradarajan on 8/20  COmplained of SOB  Progressive   No CP  No dizziness  No palpitations    He was set up for stress test  Found to be in atrial fibrillatoin at time  Set up to see D Carroll  Back in SR   Placed on anticoag and diltiazem   He also had an echo done that showed severe LVH   LVEF normal     I saw the pt in November 2018   SInce then he says he has noticed his HR is higher  100s to Does not think it is atrial fib  He is under increased stress    Had home sleep study  Found to have severe sleep apnea  Underagoing approval for CPAP titration    Denies CP   Breathing is stable     Current Meds  Medication Sig  . clonazePAM (KLONOPIN) 2 MG tablet Take 2 mg by mouth at bedtime.  Marland Kitchen. diltiazem (CARTIA XT) 240 MG 24 hr capsule Take 1 capsule (240 mg total) daily by mouth.  . losartan (COZAAR) 100 MG tablet Take 1 tablet (100 mg total) daily by mouth.  . niacin 500 MG tablet Take 500 mg by mouth at bedtime.  Marland Kitchen. testosterone cypionate (DEPOTESTOSTERONE CYPIONATE) 200 MG/ML injection Inject into the muscle every 14 (fourteen) days. .75 cc  . triamterene-hydrochlorothiazide (MAXZIDE-25) 37.5-25 MG tablet Take 1 tablet daily by mouth.  [name] XARELTO 20 MG TABS tablet TAKE 1 TABLET BY MOUTH EVERY DAY WITH SUPPER     Allergies:   Patient has no known allergies.   Past Medical History:  Diagnosis Date  . A-fib (HCC)   . H/O dislocation of shoulder   . History of morbid obesity   . Hypertension   . KNEE PAIN 09/09/2009   Qualifier: Diagnosis of  By: Christell ConstantMoore CMA, [place]    . Palpitations   . Shoulder pain   . SOB (shortness of  breath)     History reviewed. No pertinent surgical history.   Social History:  The patient  reports that  has never smoked. he has never used smokeless tobacco. He reports that he does not drink alcohol or use drugs.   Family History:  The patient's family history includes Healthy in his son.    ROS:  Please see the history of present illness. All other systems are reviewed and  Negative to the above problem except as noted.    PHYSICAL EXAM: VS:  BP (!) 138/102   Pulse (!) 102   Ht 5\' 11"  (1.803 m)   Wt (!) 316 lb (143.3 kg)   SpO2 95%   BMI 44.07 kg/m   GEN: Morbidly obese 57 yo , in no acute distress  HEENT: normal  Neck: JVP is normal     No , carotid bruits, or masses Cardiac: RRR; no murmurs, rubs, or gallops,no edema  Respiratory:  clear to auscultation bilaterally, normal work of breathing GI: soft, nontender, nondistended, + BS  No hepatomegaly  MS: no  deformity Moving all extremities   Skin: warm and dry, no rash Neuro:  Strength and sensation are intact Psych: euthymic mood, full affect   EKG:  EKG is not ordered today.   Lipid Panel No results found for: CHOL, TRIG, HDL, CHOLHDL, VLDL, LDLCALC, LDLDIRECT    Wt Readings from Last 3 Encounters:  11/08/17 (!) 316 lb (143.3 kg)  07/22/17 (!) 314 lb 12.8 oz (142.8 kg)  06/04/17 (!) 303 lb (137.4 kg)      ASSESSMENT AND PLAN:  1  PAF  Remains in SR  Continue on dilt and anticoagulation   HR is up but appears to be sinus rhythm  Regular   Would increase dilt to 360    2  HTN  Still not controlled  Sleep study with severe apnea  Waiting on approval for CPAP titration    3  Sleep apnea  Severe  Waiting on study for titration    4  HL Will follow for now  F/U  In 10 to 12 wks  Hopefully will have CPAP by then and may be able to pull back on meds     Current medicines are reviewed at length with the patient today.  The patient does not have concerns regarding medicines.  Signed, Dietrich Pates, MD    11/08/2017 2:35 PM    [facility] [address] [place], [place], [place]  [address] Phone: [phone]; Fax: [phone]

## 2017-11-08 NOTE — Patient Instructions (Signed)
Your physician has recommended you make the following change in your medication:  1.) increase diltiazem to 360 mg daily  Your physician recommends that you return for lab work in: today (BMET, CBC, LIPIDS, TSH)  Your physician recommends that you schedule a follow-up appointment in: 3 MONTHS WITH DR. Tenny CrawOSS.

## 2017-11-09 LAB — BASIC METABOLIC PANEL
BUN/Creatinine Ratio: 21 — ABNORMAL HIGH (ref 9–20)
BUN: 25 mg/dL — ABNORMAL HIGH (ref 6–24)
CO2: 21 mmol/L (ref 20–29)
Calcium: 9.2 mg/dL (ref 8.7–10.2)
Chloride: 106 mmol/L (ref 96–106)
Creatinine, Ser: 1.19 mg/dL (ref 0.76–1.27)
GFR calc Af Amer: 78 mL/min/{1.73_m2} (ref >59)
GFR calc non Af Amer: 67 mL/min/{1.73_m2} (ref >59)
Glucose: 93 mg/dL (ref 65–99)
Potassium: 4.5 mmol/L (ref 3.5–5.2)
Sodium: 144 mmol/L (ref 134–144)

## 2017-11-09 LAB — CBC
Hematocrit: 51.4 % — ABNORMAL HIGH (ref 37.5–51.0)
Hemoglobin: 18 g/dL — ABNORMAL HIGH (ref 13.0–17.7)
MCH: 29.8 pg (ref 26.6–33.0)
MCHC: 35 g/dL (ref 31.5–35.7)
MCV: 85 fL (ref 79–97)
Platelets: 234 10*3/uL (ref 150–379)
RBC: 6.04 x10E6/uL — ABNORMAL HIGH (ref 4.14–5.80)
RDW: 13.5 % (ref 12.3–15.4)
WBC: 7.3 10*3/uL (ref 3.4–10.8)

## 2017-11-09 LAB — LIPID PANEL
Chol/HDL Ratio: 6.3 ratio — ABNORMAL HIGH (ref 0.0–5.0)
Cholesterol, Total: 190 mg/dL (ref 100–199)
HDL: 30 mg/dL — ABNORMAL LOW (ref >39)
Triglycerides: 441 mg/dL — ABNORMAL HIGH (ref 0–149)

## 2017-11-09 LAB — TSH: TSH: 2.42 u[IU]/mL (ref 0.450–4.500)

## 2017-11-12 ENCOUNTER — Other Ambulatory Visit: Payer: Self-pay | Admitting: *Deleted

## 2017-11-12 DIAGNOSIS — E781 Pure hyperglyceridemia: Secondary | ICD-10-CM

## 2017-11-12 MED ORDER — ICOSAPENT ETHYL 1 G PO CAPS: 2.0000 g | ORAL_CAPSULE | Freq: Two times a day (BID) | ORAL | 6 refills | Status: DC

## 2017-11-12 NOTE — Progress Notes (Signed)
Reviewed lab work with patient. Verbalizes understanding. Will stop Niaspan and start Vascepa. Plan for repeat lipid panel in 6 weeks. -April 19. Scheduled.

## 2017-11-30 NOTE — Telephone Encounter (Signed)
Patient called to check on the status of apap titration. Message sent to doctor Turner on 3/4 asking for permission to proceed with apap. Still awaiting recommendation.

## 2017-12-02 NOTE — Telephone Encounter (Signed)
Per doctor Turner home titration will not be adequate for patient. Patient needs in lab titration order is in epic. I have resubmitted to pre cert for in lab study.

## 2017-12-02 NOTE — Telephone Encounter (Signed)
In lab titration denied  Deidre AlaHiggins, Amy C  Kapri Nero G, CMA; Quintella Reicherturner, Traci R, MD        In lab titration was denied by insurance. Would you like to appeal or switch to APAP as recommended by patient's insurance? Thank you.     ----- Message -----  From: Reesa ChewJones, Alexea Blase G, CMA  Sent: 11/08/2017  3:08 PM  To: [facility] Sleep Studies  Subject: pre cert                     in lab CPAP titration

## 2017-12-02 NOTE — Telephone Encounter (Signed)
Patient called to check on the status of his APAP machine.

## 2017-12-08 NOTE — Telephone Encounter (Signed)
Home APAP  Michael Mcgee, Michael Mcgee, CMA  Michael Mcgee, Michael R, MD        Amy in precert says patients insurance WILL pre cert for a APAP titration but not a in lab titration .   1st night of test he was below 90%.. 28% of the time...(total recorded time 5+ hours   2nd night of test he was below 90%... 7% of the time( total recorded time 5+ hours)   3rd night he was only tested and recorded for (41 minutes) where it says he was below 90%.. for 58% of the time. adds up to 24 minutes.  If the insurance is basing his test off the in lab that's why it's denied.

## 2017-12-08 NOTE — Telephone Encounter (Signed)
RE: In lab titration denied  Reesa ChewJones, Kaydence Menard G, CMA  Janeece AgeeHiggins, Amy C        Dr Mayford Knifeurner says to please send this back to insurance because the patient has severe OSA and he can not be adequately titrated at home on on APAP. Doctor Mayford Knifeurner says he was below 90% oxygen 58% of the time of his study.  Thanks

## 2017-12-10 ENCOUNTER — Telehealth: Payer: Self-pay | Admitting: *Deleted

## 2017-12-10 DIAGNOSIS — G4733 Obstructive sleep apnea (adult) (pediatric): Secondary | ICD-10-CM

## 2017-12-10 NOTE — Telephone Encounter (Signed)
-----   Message from Quintella Reichertraci R Turner, MD sent at 12/08/2017  5:28 PM EDT ----- Regarding: RE: Home APAP Please order an Airsense CPAP with autotitration from 4 to 18cm H2O with ResMed full face mask.  Followup with me in 10 weeks  Traci ----- Message ----- From: Reesa ChewJones, Jyren Cerasoli G, CMA Sent: 12/08/2017   2:16 PM To: Quintella Reichertraci R Turner, MD Subject: Home APAP                                      Amy in precert says patients insurance WILL pre cert for a APAP titration but not a in lab titration .   1st night of test he was below 90%.. 28% of the time...(total recorded time 5+ hours  2nd night of test he was below 90%... 7% of the time( total recorded time 5+ hours)  3rd night he was only tested and recorded  for (41 minutes)  where it says he was below 90%.. for 58% of the time.  adds up to 24 minutes.  If the insurance is basing his test off the in lab that's why it's denied.

## 2017-12-10 NOTE — Telephone Encounter (Signed)
Patient called to get update on titration. Auto titration ordered with face mask sent to [facility] will get a download 2 weeks after patient is set up for auto titration results. Still needs 10 week compliance appointment.

## 2017-12-16 NOTE — Telephone Encounter (Signed)
Staff message sent to Dr Mayford Knifeurner stating patient needs a Peer to Peer appeal for this patient to have an in lab titration per your recommendation due to severe OSA and not being able to be adequately titrated on home Apap. Patient has history of afib and LVH. Followed by Dr Tenny Crawoss and Rudi Cocoonna Carroll.

## 2017-12-17 NOTE — Telephone Encounter (Signed)
Spoke to [facility] Bel[facility] Cruise(Kristin) who states she needed patients sleep study to move forward with his care and that was sent over today.

## 2017-12-17 NOTE — Telephone Encounter (Addendum)
Patient was referred to [facility] on 12/15/17 because he was out of network with [facility] Will fax order to [facility] for the Auto titration ordered with face mask sent to [facility] will get a download 2 weeks after patient is set up for auto titration results. Still needs 10 week compliance appointment.

## 2017-12-17 NOTE — Telephone Encounter (Addendum)
Patient's wife Michael Mcgee came into the office today with concerns that her husband has not had his Titration study and she feels his health is declining. Wife states "she will file a compliant by Wednesday 01/21/18 if she does not hear from our office that we have made progress on this matter". Wife states "his insurance says we have not responded to them concerning their recommendation to appeal or switch to APAP." Dr Mayford Knifeurner has an order for the patient in epic.    Message from Quintella Reichertraci R Turner, MD sent at 12/08/2017  5:28 PM EDT ----- Regarding: RE: Home APAP Please order an Airsense CPAP with autotitration from 4 to 18cm H2O with ResMed full face mask.  Followup with me in 10 weeks      [facility] and was informed she was waiting on the prior approval. The patient states "his insurance told him it has already been approved."  [facility] and she does not see any documents in their system for the approval. Wife was informed before she left the office that our office will get Dr Mayford Knifeurner set up for a peer to peer phone visit on Monday 12/20/17.  Called [employer] at [phone] spoke to [facility] peer to peer who stated she needed a 4 hour window for the peer to peer review dated for Monday 12/20/17 from 8 am -12 pm. The medical doctor will call pod G at [phone]. There will be 2 attempts made to reach Dr Mayford Knifeurner. If the denial is overturned the authorization will go out on Monday 12/20/17.  I called the patient to update him on the information and he was grateful for the call. Patient states his insurance told him that "they rejected the in lab CPAP titration because he did not have any co morbidities but they have approved the auto titration machine." Patient states " he initially declined the in lab titration but he will do whatever Dr Mayford Knifeurner thinks is best including the in lab titration now".

## 2017-12-20 NOTE — Telephone Encounter (Signed)
[facility] Julien Girt(Kelly Bravo) confirmed they received all the information to process patient order. Tresa EndoKelly says it take 4 business days to process and they already have someone assigned to process the order. They will send the patient an e-mail and call them to either have the order shipped out to them or schedule an appointment at the local office for education and pick up.

## 2017-12-20 NOTE — Telephone Encounter (Signed)
See February 21 note: Dr Mayford Knifeurner felt the patient needed a in lab study and it was denied. Dr Mayford Knifeurner has started a Peer to Peer review today with patient's insurance and she has recommended they approve his study. The insurance states they will notify our office with their decision and the authorization number.  They ask us to call the insurance company in 48 hours to get authorization. We will call [employer] on Wednesday afternoon to get the authorization. I will contact the patient and his wife today with the update. Called lmtcb

## 2017-12-22 ENCOUNTER — Encounter: Payer: Self-pay | Admitting: *Deleted

## 2017-12-22 NOTE — Telephone Encounter (Addendum)
Fax came on 12/21/17 for patient that his in lab titration has been approved. Patient has been scheduled for Jan 17 2018.  I will notify patient and a letter will be sent today. Patient has been informed to call the sleep lab at [phone] directly if this appointment does not work for him. Called home phone no answer no voicemail set up. Called cell number left message that his titration was approved and lmtcb Thursday when I return.

## 2017-12-24 ENCOUNTER — Other Ambulatory Visit: Payer: Managed Care, Other (non HMO)

## 2017-12-31 ENCOUNTER — Other Ambulatory Visit (HOSPITAL_COMMUNITY): Payer: Self-pay | Admitting: Nurse Practitioner

## 2018-01-15 ENCOUNTER — Encounter (HOSPITAL_BASED_OUTPATIENT_CLINIC_OR_DEPARTMENT_OTHER): Payer: Managed Care, Other (non HMO)

## 2018-01-17 ENCOUNTER — Ambulatory Visit (HOSPITAL_BASED_OUTPATIENT_CLINIC_OR_DEPARTMENT_OTHER): Payer: Managed Care, Other (non HMO) | Attending: Cardiology | Admitting: Cardiology

## 2018-01-17 VITALS — Ht 71.0 in | Wt 315.0 lb

## 2018-01-17 DIAGNOSIS — Z79899 Other long term (current) drug therapy: Secondary | ICD-10-CM | POA: Insufficient documentation

## 2018-01-17 DIAGNOSIS — Z7901 Long term (current) use of anticoagulants: Secondary | ICD-10-CM | POA: Diagnosis not present

## 2018-01-17 DIAGNOSIS — I471 Supraventricular tachycardia: Secondary | ICD-10-CM | POA: Insufficient documentation

## 2018-01-17 DIAGNOSIS — G4733 Obstructive sleep apnea (adult) (pediatric): Secondary | ICD-10-CM

## 2018-01-17 DIAGNOSIS — G4736 Sleep related hypoventilation in conditions classified elsewhere: Secondary | ICD-10-CM | POA: Insufficient documentation

## 2018-01-18 NOTE — Procedures (Signed)
   Patient Name: Michael Mcgee, Michael Mcgee Date: 01/17/2018 Gender: Male D.O.B: [birth year] Age (years): 57 Referring Provider: Armanda Magic MD, ABSM Height (inches): 72 Interpreting Physician: Armanda Magic MD, ABSM Weight (lbs): 315 RPSGT: Armen Pickup BMI: 43 MRN: [id number] Neck Size: 18.50  CLINICAL INFORMATION The patient is referred for a BiPAP titration to treat sleep apnea.  SLEEP STUDY TECHNIQUE As per the AASM Manual for the Scoring of Sleep and Associated Events v2.3 (April 2016) with a hypopnea requiring 4% desaturations.  The channels recorded and monitored were frontal, central and occipital EEG, electrooculogram (EOG), submentalis EMG (chin), nasal and oral airflow, thoracic and abdominal wall motion, anterior tibialis EMG, snore microphone, electrocardiogram, and pulse oximetry. Bilevel positive airway pressure (BPAP) was initiated at the beginning of the study and titrated to treat sleep-disordered breathing.  MEDICATIONS Medications self-administered by patient taken the night of the study : VASCEPA, XARELTO  RESPIRATORY PARAMETERS Optimal IPAP Pressure (cm): N/A  AHI at Optimal Pressure (/hr) N/A Optimal EPAP Pressure (cm):N/A  Overall Minimal O2 (%):86.0  Minimal O2 at Optimal Pressure (%): N/A  SLEEP ARCHITECTURE Start Time:9:55:50 PM  Stop Time:4:03:39 AM  Total Time (min):367.8  Total Sleep Time (min):254 Sleep Latency (min):9.3  Sleep Efficiency (%):69.0%  REM Latency (min):62.0  WASO (min):104.5 Stage N1 (%):6.3%  Stage N2 (%):44.1%  Stage N3 (%):0.0%  Stage R (%):49.60 Supine (%):80.71  Arousal Index (/hr):29.3   CARDIAC DATA The 2 lead EKG demonstrated sinus rhythm. The mean heart rate was 83.7 beats per minute. Other EKG findings include: PVCs, PACs and nonsustained atrial tachycardia.  LEG MOVEMENT DATA The total Periodic Limb Movements of Sleep (PLMS) were 0. The PLMS index was 0.0. A PLMS index of <15 is considered normal in  adults.  IMPRESSIONS - An optimal PAP pressure could not be selected for this patient due to ongoing respiratory events with CPAP - Central sleep apnea was not noted during this titration (CAI = 0.0/h). - Moderete oxygen desaturations were observed during this titration (min O2 = 86.0%). - No snoring was audible during this study. - 2-lead EKG demonstrated: PVCs, PACs and nonsustained atrial tachycardia - Clinically significant periodic limb movements were not noted during this study. Arousals associated with PLMs were rare.  DIAGNOSIS - Obstructive Sleep Apnea (327.23 [G47.33 ICD-10]) - Nocturnal Hypoxemia - Nonsustained atrial tachycardia  RECOMMENDATIONS - Repeat in lab study with BIPAP titration - Avoid alcohol, sedatives and other CNS depressants that may worsen sleep apnea and disrupt normal sleep architecture. - Sleep hygiene should be reviewed to assess factors that may improve sleep quality. - Weight management and regular exercise should be initiated or continued.  [Electronically signed] 01/18/2018 09:21 PM  Armanda Magic MD, ABSM Diplomate, American Board of Sleep Medicine

## 2018-01-20 ENCOUNTER — Telehealth: Payer: Self-pay | Admitting: *Deleted

## 2018-01-20 DIAGNOSIS — G4733 Obstructive sleep apnea (adult) (pediatric): Secondary | ICD-10-CM

## 2018-01-20 NOTE — Telephone Encounter (Signed)
-----   Message from Quintella Reichert, MD sent at 01/18/2018  9:27 PM EDT ----- Please let patient know that CPAP titration was unsuccessful due to the severity of his sleep apnea and please set up in lab BIPAP titration

## 2018-01-20 NOTE — Telephone Encounter (Signed)
Informed patient of sleep study results and patient understanding was verbalized. Patient understands his titration study was unsuccessful due to the severity of his sleep apnea and please set up in lab BIPAP titration. Pt is aware and agreeable to results and recommendations.

## 2018-01-24 NOTE — Telephone Encounter (Signed)
Difficult patient Michael Mcgee, Michael Mcgee  Michael Mcgee, Michael Breeding, RN  Cc: Deliah Boston, RN        This is the man we did a peer to peer on last month.  Called results to this patient and patient verbalized to me that he does not want to be 3 or more curse word, curse word weeks getting this test done. Patient states he wants me to call him in 24 hrs with an appt. Time and date. He wants me to go ahead and start a peer to peer review before the insurance denies his test. He says he could die waiting.

## 2018-01-24 NOTE — Telephone Encounter (Signed)
Patient called and asked if I could go ahead and schedule his Bipap sleep study before his insurance gives the approval or the denial and he was told no we do not schedule any test without prior approval. Patient states he contacted the sleep lab and asked for the private payor rate which was $3,500 patient wants to know if there is a negotiated rate as opposed to the private payor rate so I reached out to [place] and she said no there was not a negotiated rate.Patient asked if he private pays for his test if our office would help him fight his insurance to get reimbursed. Patient was advised no because we are telling him up front that if he goes ahead of the insurance and private pays he will be responsible for paying the total cost of the sleep study. Patient does not want to wait the 15 days it takes to get a response from the insurance because he fears it may be life threatening. Reached out to  [place] who advised me to have the patient call his insurance carrier to answer his questions as I do not know that part of the process.

## 2018-01-25 ENCOUNTER — Telehealth: Payer: Self-pay | Admitting: *Deleted

## 2018-01-25 NOTE — Telephone Encounter (Signed)
Submitted PA request for repeat in lab  BIPAP titration to [facility].

## 2018-02-02 ENCOUNTER — Telehealth: Payer: Self-pay | Admitting: *Deleted

## 2018-02-02 NOTE — Telephone Encounter (Signed)
Staff message sent to [facility]  Approval received. Ok to schedule BIPAP titration.

## 2018-02-02 NOTE — Telephone Encounter (Signed)
-----   Message from Reesa Chew, CMA sent at 01/21/2018  2:54 PM EDT ----- Regarding: pre cert BIPAP Titration

## 2018-02-02 NOTE — Telephone Encounter (Addendum)
Patient is scheduled for BIPAP TITRATION lab study on 02/03/18. Patient understands his sleep study will be done at [facility] sleep lab. Patient agrees with treatment and thanked me for call.

## 2018-02-03 ENCOUNTER — Ambulatory Visit (HOSPITAL_BASED_OUTPATIENT_CLINIC_OR_DEPARTMENT_OTHER): Payer: Managed Care, Other (non HMO) | Attending: Cardiology | Admitting: Cardiology

## 2018-02-03 VITALS — Ht 71.0 in | Wt 315.0 lb

## 2018-02-03 DIAGNOSIS — G4733 Obstructive sleep apnea (adult) (pediatric): Secondary | ICD-10-CM | POA: Insufficient documentation

## 2018-02-03 DIAGNOSIS — R0683 Snoring: Secondary | ICD-10-CM | POA: Diagnosis not present

## 2018-02-03 DIAGNOSIS — Z7901 Long term (current) use of anticoagulants: Secondary | ICD-10-CM | POA: Diagnosis not present

## 2018-02-03 DIAGNOSIS — Z79899 Other long term (current) drug therapy: Secondary | ICD-10-CM | POA: Diagnosis not present

## 2018-02-03 DIAGNOSIS — I493 Ventricular premature depolarization: Secondary | ICD-10-CM | POA: Insufficient documentation

## 2018-02-04 ENCOUNTER — Ambulatory Visit: Payer: Managed Care, Other (non HMO) | Admitting: Internal Medicine

## 2018-02-04 ENCOUNTER — Encounter: Payer: Self-pay | Admitting: Internal Medicine

## 2018-02-04 VITALS — BP 132/94 | HR 96 | Ht 71.0 in | Wt 325.8 lb

## 2018-02-04 DIAGNOSIS — I10 Essential (primary) hypertension: Secondary | ICD-10-CM

## 2018-02-04 DIAGNOSIS — G4733 Obstructive sleep apnea (adult) (pediatric): Secondary | ICD-10-CM

## 2018-02-04 DIAGNOSIS — I480 Paroxysmal atrial fibrillation: Secondary | ICD-10-CM | POA: Diagnosis not present

## 2018-02-04 DIAGNOSIS — E782 Mixed hyperlipidemia: Secondary | ICD-10-CM

## 2018-02-04 MED ORDER — TRIAMTERENE-HCTZ 37.5-25 MG PO TABS: 1.0000 | ORAL_TABLET | Freq: Every day | ORAL | 3 refills | Status: DC

## 2018-02-04 NOTE — Progress Notes (Signed)
Cardiology Office Note   Date:  02/04/2018   ID:  Michael Mcgee, DOB [birth year], MRN [id number]  PCP:  Lorenda Ishihara, MD  Cardiologist:   Dietrich Pates, MD   F/u of HTN and atrial fib      History of Present Illness: Michael Mcgee is a 56 y.o. male with no known CAD  Does have a history of HTN, HL, atrial fibrillation   Echo with severe LVH He was last in clinic in march    I had recomm increasing dilt to 360   ALso recomm CPAP titration  Unfort since seen he just had sleep eval   Has not heard back  He says breathing is OK   No palpitations    No CP   BP has been high   Current Meds  Medication Sig  . clonazePAM (KLONOPIN) 2 MG tablet Take 2 mg by mouth at bedtime.  Marland Kitchen diltiazem (CARDIZEM CD) 360 MG 24 hr capsule Take 1 capsule (360 mg total) by mouth daily.  [name] Ethyl (VASCEPA) 1 g CAPS Take 2 capsules (2 g total) by mouth 2 (two) times daily.  Marland Kitchen losartan (COZAAR) 100 MG tablet Take 1 tablet (100 mg total) daily by mouth.  . testosterone cypionate (DEPOTESTOSTERONE CYPIONATE) 200 MG/ML injection Inject into the muscle every 14 (fourteen) days. .75 cc  . triamterene-hydrochlorothiazide (MAXZIDE-25) 37.5-25 MG tablet Take 1 tablet daily by mouth.  [name] 20 MG TABS tablet TAKE 1 TABLET BY MOUTH EVERY DAY WITH SUPPER     Allergies:   Patient has no known allergies.   Past Medical History:  Diagnosis Date  . A-fib (HCC)   . H/O dislocation of shoulder   . History of morbid obesity   . Hypertension   . KNEE PAIN 09/09/2009   Qualifier: Diagnosis of  By: Christell Constant CMA, [place]    . Palpitations   . Shoulder pain   . SOB (shortness of breath)     History reviewed. No pertinent surgical history.   Social History:  The patient  reports that he has never smoked. He has never used smokeless tobacco. He reports that he does not drink alcohol or use drugs.   Family History:  The patient's family history includes Healthy in his son.    ROS:  Please see the  history of present illness. All other systems are reviewed and  Negative to the above problem except as noted.    PHYSICAL EXAM: VS:  BP (!) 132/94   Pulse 96   Ht  (1.803 m)   Wt (!) 147.8 kg (325 lb 12.8 oz)   SpO2 94%   BMI 45.44 kg/m   GEN: Morbidly obese 57 yo , in no acute distress  HEENT: normal  Neck: JVP is not elevated     No , carotid bruits, or masses Cardiac: RRR; no murmurs, rubs, or gallops,no edema  Respiratory:  clear to auscultation bilaterally, normal work of breathing GI: soft, nontender, nondistended, + BS  No hepatomegaly  MS: no deformity Moving all extremities   Skin: warm and dry, no rash Neuro:  Strength and sensation are intact Psych: euthymic mood, full affect   EKG:  EKG is not ordered today.   Lipid Panel    Component Value Date/Time   CHOL 190 11/08/2017 1513   TRIG 441 (H) 11/08/2017 1513   HDL 30 (L) 11/08/2017 1513   CHOLHDL 6.3 (H) 11/08/2017 1513   LDLCALC Comment 11/08/2017 1513  Wt Readings from Last 3 Encounters:  02/04/18 (!) 147.8 kg (325 lb 12.8 oz)  02/03/18 (!) 142.9 kg (315 lb)  01/17/18 (!) 142.9 kg (315 lb)      ASSESSMENT AND PLAN:  1  PAF  Remains in SR  Continue on dilt and anticoagulation  Keep on current regimen   No symptoms   2  HTN  Still not controlled  Just had sleep eval   WIll need to be followed after CPAP adjusted    3  Sleep apnea  Severe  Waiting on study for titration    4  HL Will follow for now  5   Morbid obesity  Needs to lose wt F/U base on response of BP to CPAP   Pt will communicate via mychart/call   Current medicines are reviewed at length with the patient today.  The patient does not have concerns regarding medicines.  Signed, Dietrich Pates, MD  02/04/2018 3:30 PM    [facility] [address] [place], [place], [place]  [address] Phone: [phone]; Fax: [phone]

## 2018-02-04 NOTE — Patient Instructions (Signed)
Your physician recommends that you continue on your current medications as directed. Please refer to the Current Medication list given to you today.  We are awaiting the results of your most recent sleep study.

## 2018-02-08 ENCOUNTER — Telehealth: Payer: Self-pay | Admitting: Internal Medicine

## 2018-02-08 NOTE — Telephone Encounter (Signed)
New message    Did you get the sleep apnea results back, Dr Tenny Crawoss was to expedite the results asap  Put him through to Coralee Northina , he wants this message to go to Dr Tenny Crawoss

## 2018-02-08 NOTE — Telephone Encounter (Signed)
Spoke to patient and informed him his results are not in yet and Dr Tenny Crawoss will be able to view his results in epic when they are uploaded. Pt is agreeable to treatment.

## 2018-02-14 ENCOUNTER — Telehealth: Payer: Self-pay | Admitting: *Deleted

## 2018-02-14 DIAGNOSIS — G4733 Obstructive sleep apnea (adult) (pediatric): Secondary | ICD-10-CM

## 2018-02-14 NOTE — Telephone Encounter (Signed)
Patient completed BIPAP Titration on 5/30.Patient has called several times in the last week to get titration results. I have told the patient several times that Dr Mayford Knifeurner has not yet read his results I have assured him today that Dr Mayford Knifeturner is aware of hid study and it needing to be resulted. Patient has several concerns that I feel should be addressed by management. Per patient request I gave contact information for Dennis BastKelly Lanier and Jim Likeeri [employer]its who may be better suited to help. Patient is aware and agreeable.

## 2018-02-15 NOTE — Procedures (Signed)
   Patient Name: Michael Mcgee, [name] Study Date: 02/03/2018 Gender: Male D.O.B: May 19, 1961 Age (years): [age] Referring Provider: Armanda Magicraci Jamiah Homeyer MD, ABSM Height (inches): 72 Interpreting Physician: Armanda Magicraci Deletha Jaffee MD, ABSM Weight (lbs): 315 RPSGT: Lise AuerGibson,RPSGT, Theresa BMI: 43 MRN: [id number] Neck Size: 18.50  CLINICAL INFORMATION The patient is referred for a BiPAP titration to treat sleep apnea.  SLEEP STUDY TECHNIQUE As per the AASM Manual for the Scoring of Sleep and Associated Events v2.3 (April 2016) with a hypopnea requiring 4% desaturations.  The channels recorded and monitored were frontal, central and occipital EEG, electrooculogram (EOG), submentalis EMG (chin), nasal and oral airflow, thoracic and abdominal wall motion, anterior tibialis EMG, snore microphone, electrocardiogram, and pulse oximetry. Bilevel positive airway pressure (BPAP) was initiated at the beginning of the study and titrated to treat sleep-disordered breathing.  MEDICATIONS Medications self-administered by patient taken the night of the study : VASCEPA, XARELTO  RESPIRATORY PARAMETERS Optimal IPAP Pressure (cm): 13  AHI at Optimal Pressure (/hr) 0 Optimal EPAP Pressure (cm):9  Overall Minimal O2 (%):80.0  Minimal O2 at Optimal Pressure (%):86.0  SLEEP ARCHITECTURE Start Time:10:11:40 PM  Stop Time:4:57:30 AM  Total Time (min):405.8  Total Sleep Time (min):248.5 Sleep Latency (min):13.4  Sleep Efficiency (%):61.2%  REM Latency (min):66.0  WASO (min): 144.0 Stage N1 (%):14.9%  Stage N2 (%):49.1%  Stage N3 (%):0.0%  Stage R (%):36.02 Supine (%):0.00 Arousal Index (/hr):28.0   CARDIAC DATA The 2 lead EKG demonstrated sinus rhythm. The mean heart rate was 75.2 beats per minute. Other EKG findings include: PVCs.  LEG MOVEMENT DATA The total Periodic Limb Movements of Sleep (PLMS) were 0. The PLMS index was 0.0. A PLMS index of <15 is considered normal in adults.  IMPRESSIONS - An optimal PAP pressure  of 13/9cm H2O was selected for this patient based on the available study data. - Central sleep apnea was not noted during this titration (CAI = 1.0/h). - Severe oxygen desaturations were observed during this titration (min O2 = 80.0%). - The patient snored with loud snoring volume. - 2-lead EKG demonstrated: PVCs - Clinically significant periodic limb movements were not noted during this study. Arousals associated with PLMs were rare.  DIAGNOSIS - Obstructive Sleep Apnea (327.23 [G47.33 ICD-10])  RECOMMENDATIONS - Recommend Airsense BiPAP at 13/9cm H2O with heated humidity and mask of choice. - Avoid alcohol, sedatives and other CNS depressants that may worsen sleep apnea and disrupt normal sleep architecture. - Sleep hygiene should be reviewed to assess factors that may improve sleep quality. - Weight management and regular exercise should be initiated or continued. - Return to Sleep Center for re-evaluation after 10 weeks of therapy  [Electronically signed] 02/15/2018 11:30 PM  Armanda Magicraci Amauri Keefe MD, ABSM Diplomate, American Board of Sleep Medicine

## 2018-02-15 NOTE — Telephone Encounter (Signed)
Spoke with Dr Mayford Knifeurner who stated it is not unusual for it to take a week or so to read a sleep study.  She is going to read Michael Mcgee's sleep study later today.  Pt will be notified when results are available. Pt is aware and agreeable.  He shared his concerns regarding the sleep study process.   Jim Likeeri Sid Greener MHA RN CCM

## 2018-02-16 NOTE — Telephone Encounter (Signed)
  Michael Mcgee, Michael Mcgee, Michael Mcgee  Michael Mcgee, Michael Mcgee, CMA        Please let patient know that they had a successful PAP titration and [facility] Please set up 10 week OV with me. Please get overnight pulse ox on BiPAP to see if supplemental O2 is needed      Informed patient of sleep study results and patient understanding was verbalized. Patient understands his sleep study showed he had a successful BiPAP TITRATION and Dr Mayford Knifeurner has ordered him a BiPAP and an overnight pulse ox to see if supplemental 02 is needed. Pt is aware and agreeable to these results.   Upon patient request DME selection is [facility]. [facility]. [facility] a timely manner. Patient understands they will be called once [facility] that [facility] have [facility] their new machine to schedule 10 week follow up appointment.  [facility] notified of new cpap order  Please add to airview Patient was grateful for the call and thanked me.     overnight pulse ox on BiPAP to see if supplemental O2 is needed  [facility]

## 2018-02-25 NOTE — Telephone Encounter (Signed)
Patient called to say he has received his cpap and has used it for two days. Patient states he is sleeping much better since receiving his CPAP. Patient states he is supposed to do an overnight pulse oximetry but he is going out of town for a week 02/28/18 -03/04/18 and wants to wait until his return to take the test. Patient has been encouraged by the medical staff to contact his DME upon his return to complete his test. Pt is aware and agreeable to treatment.

## 2018-03-01 NOTE — Telephone Encounter (Signed)
Patient has a 10 week follow up appointment scheduled for 9/9/ 2019 at 1:40. Patient understands she needs to keep this appointment for insurance compliance. Patient was grateful for the call and thanked me.

## 2018-03-15 ENCOUNTER — Encounter: Payer: Self-pay | Admitting: Cardiology

## 2018-03-16 ENCOUNTER — Telehealth: Payer: Self-pay | Admitting: *Deleted

## 2018-03-16 NOTE — Telephone Encounter (Signed)
Informed patient of ONO results and patient understanding was verbalized. Patient understands his ONO showed he had No O2 desaturations on PAP therapy and  No supplemental O2 is needed.  Pt is aware and agreeable to normal results.

## 2018-03-16 NOTE — Telephone Encounter (Signed)
-----   Message from Traci R Turner, MD sent at 03/16/2018  1:43 PM EDT ----- No O2 desaturations on PAP therapy.  No supplemental O2 needed. 

## 2018-03-16 NOTE — Telephone Encounter (Signed)
Informed patient of ONO results and patient understanding was verbalized. Patient understands his ONO showed he had No O2 desaturations on PAP therapy and  No supplemental O2 is needed.  Pt is aware and agreeable to normal results. 

## 2018-03-16 NOTE — Telephone Encounter (Signed)
-----   Message from Quintella Reichertraci R Turner, MD sent at 03/16/2018  1:43 PM EDT ----- No O2 desaturations on PAP therapy.  No supplemental O2 needed.

## 2018-04-13 IMAGING — US US ABDOMEN COMPLETE
1 series · 13 of 25 positions shown · non-contrast
Comparison: None.

CLINICAL DATA: Follow-up artifact seen associated with IVC dated
05/03/2017

EXAM:
ABDOMEN ULTRASOUND COMPLETE

[Series 1: us abdomen complete · 0.28mm/px · 13 of 95 slices shown]
[im 1/95]
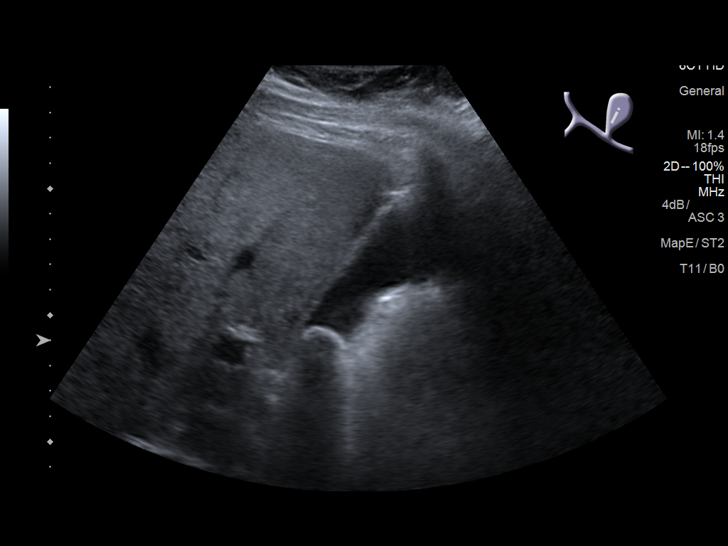
[im 8/95]
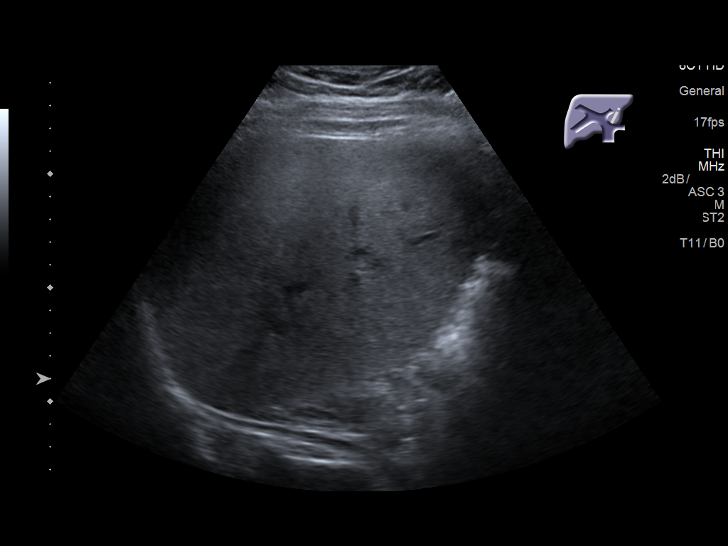
[im 16/95]
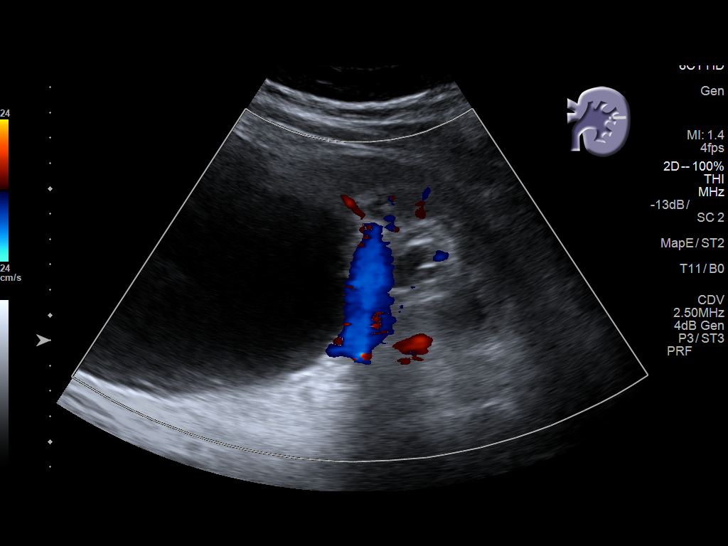
[im 24/95]
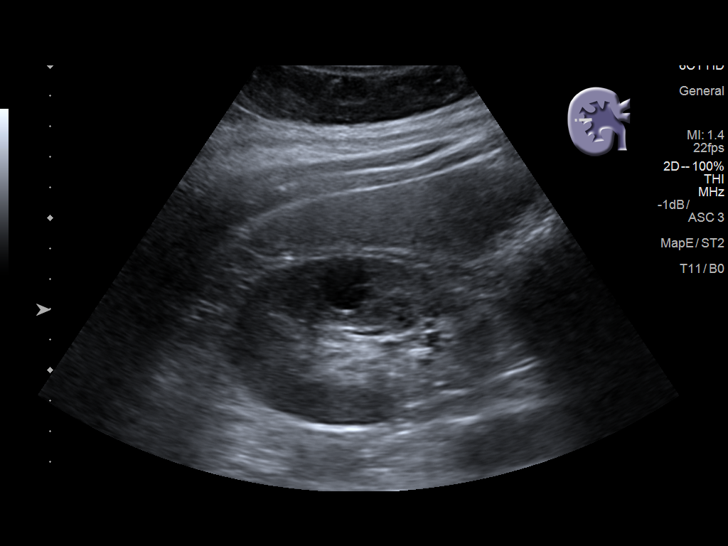
[im 32/95]
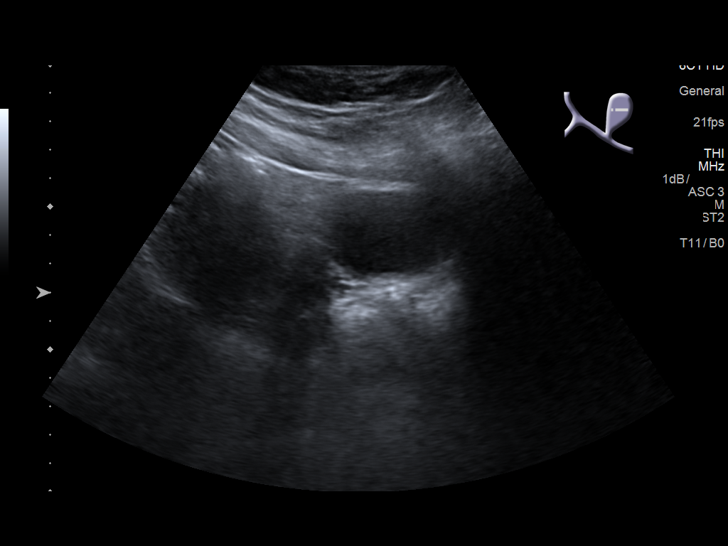
[im 40/95]
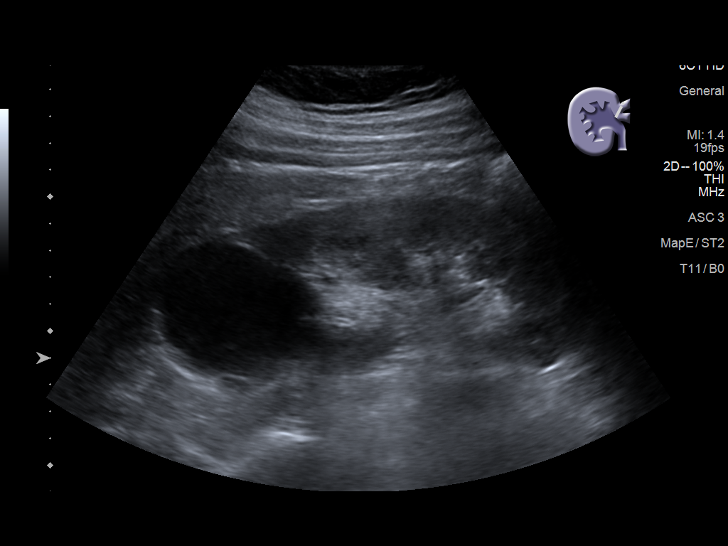
[im 48/95]
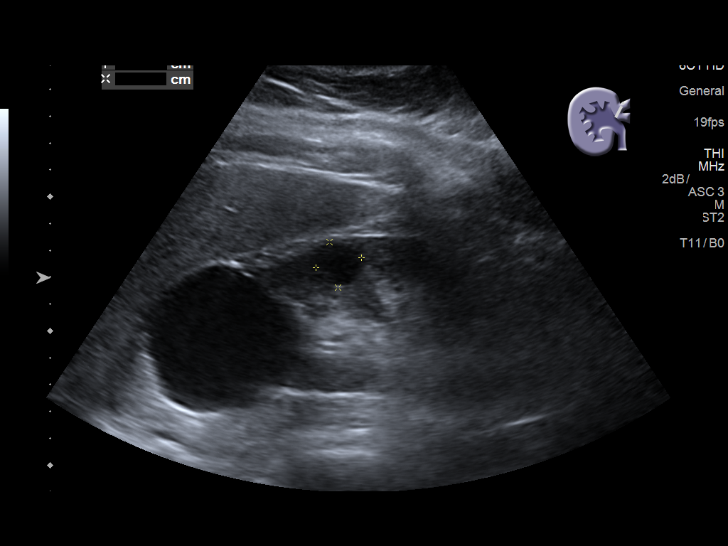
[im 55/95]
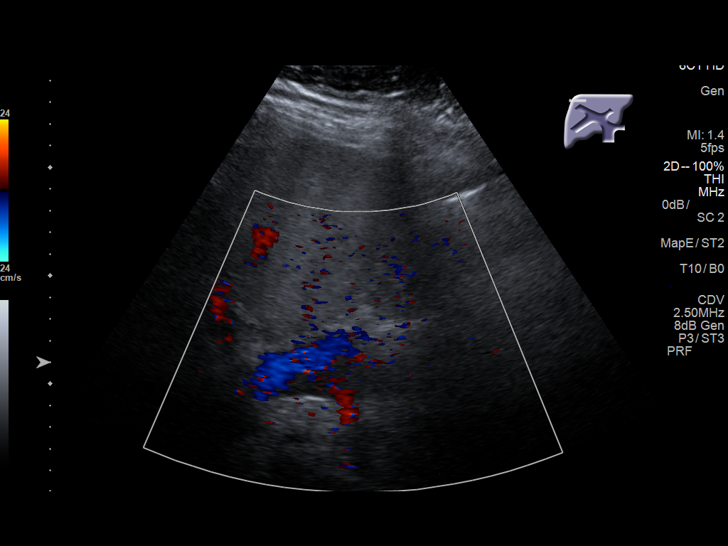
[im 63/95]
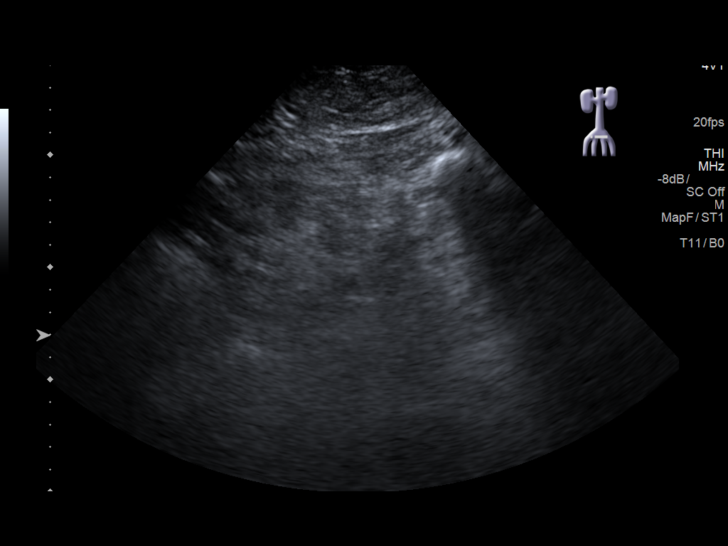
[im 71/95]
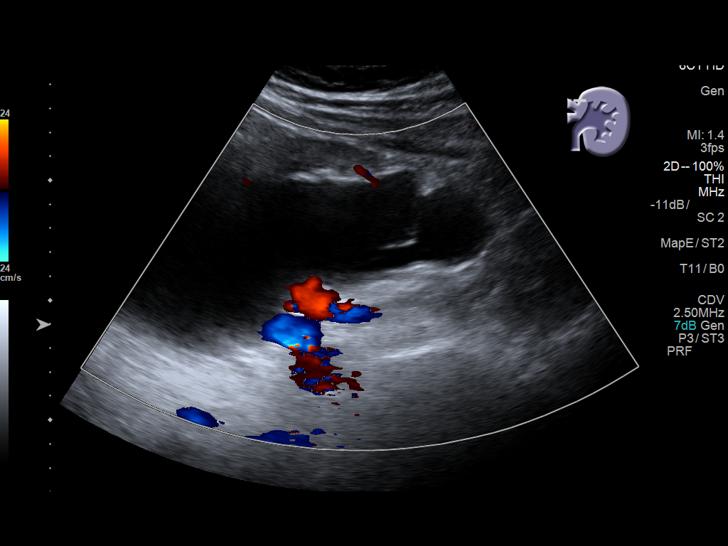
[im 79/95]
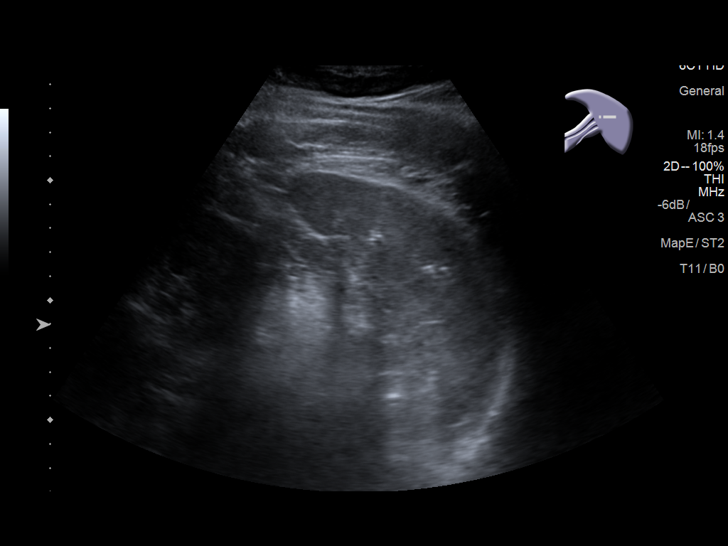
[im 87/95]
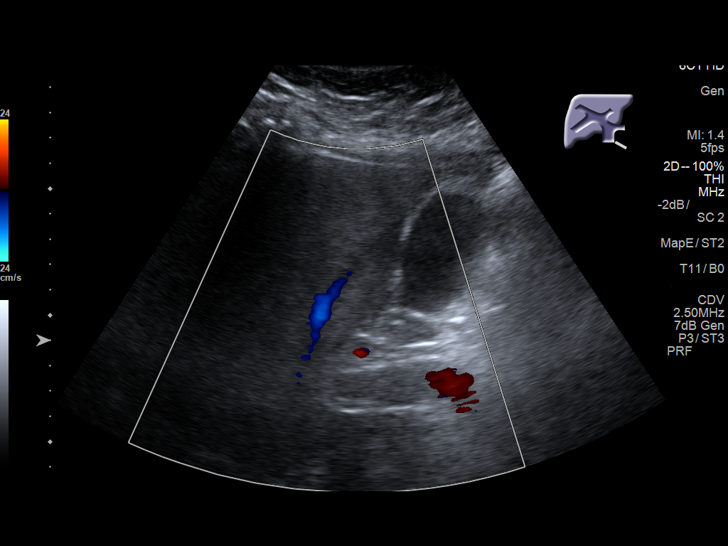
[im 95/95]
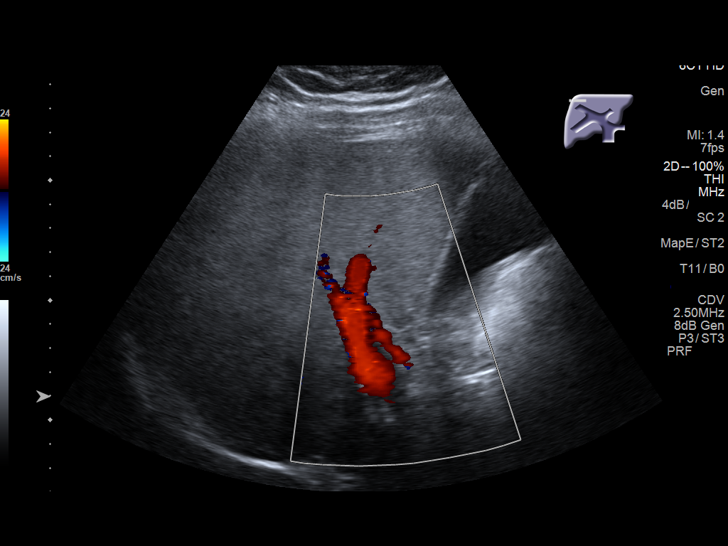

[13 of 25 positions shown; findings below may reference images not displayed]

FINDINGS: Gallbladder: Shadowing 1.9 cm gallstone within the dependent portion
of the gallbladder. No gallbladder wall thickening visualized. No
sonographic Murphy sign noted by sonographer.

Common bile duct: Diameter: 4 mm

Liver: No focal lesion identified. Diffusely increased parenchymal
echogenicity. Portal vein is patent on color Doppler imaging with
normal direction of blood flow towards the liver.

IVC: No abnormality visualized, however inferior inferior vena cava
is obscured by bowel gas.

Pancreas: Visualized portion unremarkable.

Spleen: Size and appearance within normal limits.

Right Kidney: Length: 17.1 cm. Echogenicity within normal limits. No
hydronephrosis visualized. Large partially exophytic cyst in the
upper pole of the right kidney measures 6.4 x 5.6 x 5.0 cm a second
simple appearing cyst in the midpole of the right kidney measures
1.7 cm in diameter.

Left Kidney: Length: 18.7 cm. Echogenicity within normal limits. No
hydronephrosis visualized. Benign-appearing mid pole cyst measures
10.3 x 10.6 x 13.5 cm. A second adjacent benign-appearing cyst
measures 1.7 cm in maximum diameter.

Abdominal aorta: No aneurysm visualized.

Other findings: None.
IMPRESSION: Cholelithiasis without evidence of cholecystitis.

Hepatic steatosis.

Enlarged kidneys due to presence of large benign-appearing cysts.

IVC is only partially visualized and unremarkable in its visualized
portion.

## 2018-04-20 DIAGNOSIS — Z0000 Encounter for general adult medical examination without abnormal findings: Secondary | ICD-10-CM | POA: Diagnosis not present

## 2018-04-22 DIAGNOSIS — E785 Hyperlipidemia, unspecified: Secondary | ICD-10-CM | POA: Diagnosis not present

## 2018-04-22 DIAGNOSIS — Z125 Encounter for screening for malignant neoplasm of prostate: Secondary | ICD-10-CM | POA: Diagnosis not present

## 2018-04-22 DIAGNOSIS — Z0000 Encounter for general adult medical examination without abnormal findings: Secondary | ICD-10-CM | POA: Diagnosis not present

## 2018-05-02 ENCOUNTER — Telehealth: Payer: Self-pay | Admitting: Internal Medicine

## 2018-05-02 NOTE — Telephone Encounter (Signed)
Will route to Dr. Tenny Crawoss and to PharmD.

## 2018-05-02 NOTE — Telephone Encounter (Signed)
New message:       Pt c/o medication issue:  1. Name of Medication: Belviq  2. How are you currently taking this medication (dosage and times per day)?   3. Are you having a reaction (difficulty breathing--STAT)?   4. What is your medication issue? Pt states his GP doctor is wanting him to go on this weight loss medication but wants to make sure if it is okay to take.

## 2018-05-02 NOTE — Telephone Encounter (Signed)
No major contraindications with patient's current medications or disease states, however all weight loss products carry many side effects and precautions.   Would advise him to monitor for any increase in blood pressure or bradycardia. Pt will need to monitor for CNS depression since he also takes clonazepam - the combination may cause confusion, somnolence, fatigue, and cognitive impairment. There is a low incidence of causing valvular heart disease, however this increase was nonsignificant after 1 year in clinical trials and pt would not be on therapy indefinitely. Would also recommend that PCP monitor CBC due to potential for decrease in both WBC and RBC.

## 2018-05-04 NOTE — Telephone Encounter (Signed)
Called patient and gave information provided by PharmD. Will send PharmD input to PCP via Epic fax at this time.  Pt agrees with this plan.  He will follow up with PCP to confirm receipt.

## 2018-05-12 DIAGNOSIS — E291 Testicular hypofunction: Secondary | ICD-10-CM | POA: Diagnosis not present

## 2018-05-12 DIAGNOSIS — R5383 Other fatigue: Secondary | ICD-10-CM | POA: Diagnosis not present

## 2018-05-12 DIAGNOSIS — R6882 Decreased libido: Secondary | ICD-10-CM | POA: Diagnosis not present

## 2018-05-12 DIAGNOSIS — G479 Sleep disorder, unspecified: Secondary | ICD-10-CM | POA: Diagnosis not present

## 2018-05-16 ENCOUNTER — Ambulatory Visit (INDEPENDENT_AMBULATORY_CARE_PROVIDER_SITE_OTHER): Payer: BLUE CROSS/BLUE SHIELD | Admitting: Cardiology

## 2018-05-16 ENCOUNTER — Encounter (INDEPENDENT_AMBULATORY_CARE_PROVIDER_SITE_OTHER): Payer: Self-pay

## 2018-05-16 ENCOUNTER — Encounter: Payer: Self-pay | Admitting: Cardiology

## 2018-05-16 DIAGNOSIS — G4733 Obstructive sleep apnea (adult) (pediatric): Secondary | ICD-10-CM | POA: Diagnosis not present

## 2018-05-16 DIAGNOSIS — Z9989 Dependence on other enabling machines and devices: Secondary | ICD-10-CM | POA: Diagnosis not present

## 2018-05-16 DIAGNOSIS — I10 Essential (primary) hypertension: Secondary | ICD-10-CM | POA: Diagnosis not present

## 2018-05-16 HISTORY — DX: Obstructive sleep apnea (adult) (pediatric): G47.33

## 2018-05-16 HISTORY — DX: Obstructive sleep apnea (adult) (pediatric): Z99.89

## 2018-05-16 HISTORY — DX: Morbid (severe) obesity due to excess calories: E66.01

## 2018-05-16 NOTE — Patient Instructions (Signed)
Medication Instructions:  Your physician recommends that you continue on your current medications as directed. Please refer to the Current Medication list given to you today.  Follow-Up: Your physician wants you to follow-up in: 1 year with Dr. Turner. You will receive a reminder letter in the mail two months in advance. If you don't receive a letter, please call our office to schedule the follow-up appointment.   If you need a refill on your cardiac medications before your next appointment, please call your pharmacy.   

## 2018-05-16 NOTE — Progress Notes (Signed)
Cardiology Office Note:    Date:  05/16/2018   ID:  Michael Mcgee, DOB [birth year], MRN [id number]  PCP:  Lorenda Ishihara, MD  Cardiologist:  No primary care provider on file.    Referring MD: Lorenda Ishihara,*   Chief Complaint  Patient presents with  . Sleep Apnea  . Hypertension    History of Present Illness:    Michael Mcgee is a 57 y.o. male with a hx of hypertension, hyperlipidemia and atrial fibrillation who was referred by Dr. Dietrich Pates for sleep study due to atrial fibrillation and hypertension.  He underwent home sleep study showing severe obstructive sleep apnea with an AHI of 61.9 with oxygen desaturations less than 89% for 1 hour 32 minutes and less than 80% for 14 minutes.  He underwent CPAP titration but failed to adequately titrate due to ongoing respiratory events and was referred for BiPAP titration.  He underwent BiPAP titration of 13/9 centimeters H2O and is now here for follow-up.  He is doing well with his PAP device and thinks that he has gotten used to it.  He tolerates the mask and feels the pressure is adequate.  Since going on PAP he feels rested in the am and has no significant daytime sleepiness.  He denies any significant mouth or nasal dryness or nasal congestion.  He does not think that he snores.     Past Medical History:  Diagnosis Date  . A-fib (HCC)   . H/O dislocation of shoulder   . History of morbid obesity   . Hypertension   . KNEE PAIN 09/09/2009   Qualifier: Diagnosis of  By: Christell Constant CMA, [place]    . Morbid obesity (HCC) 05/16/2018  . OSA on CPAP 05/16/2018   Severe obstructive sleep apnea with an AHI of 61.9 with oxygen desaturations less than 89% for 1 hour 32 minutes and less than 80% for 14 minutes. Now on BiPAP at  13/9 cm H2O  . Palpitations   . Shoulder pain   . SOB (shortness of breath)     History reviewed. No pertinent surgical history.  Current Medications: Current Meds  Medication Sig  . clonazePAM (KLONOPIN) 2 MG  tablet Take 2 mg by mouth at bedtime.  Marland Kitchen diltiazem (CARDIZEM CD) 360 MG 24 hr capsule Take 1 capsule (360 mg total) by mouth daily.  [name] Ethyl (VASCEPA) 1 g CAPS Take 2 capsules (2 g total) by mouth 2 (two) times daily.  Marland Kitchen losartan (COZAAR) 100 MG tablet Take 1 tablet (100 mg total) daily by mouth.  . testosterone cypionate (DEPOTESTOSTERONE CYPIONATE) 200 MG/ML injection Inject into the muscle every 14 (fourteen) days. .75 cc  . triamterene-hydrochlorothiazide (MAXZIDE-25) 37.5-25 MG tablet Take 1 tablet by mouth daily.  [name] 20 MG TABS tablet TAKE 1 TABLET BY MOUTH EVERY DAY WITH SUPPER     Allergies:   Patient has no known allergies.   Social History   Socioeconomic History  . Marital status: Married    Spouse name: Not on file  . Number of children: Not on file  . Years of education: Not on file  . Highest education level: Not on file  Occupational History  . Not on file  Social Needs  . Financial resource strain: Not on file  . Food insecurity:    Worry: Not on file    Inability: Not on file  . Transportation needs:    Medical: Not on file    Non-medical: Not on file  Tobacco  Use  . Smoking status: Never Smoker  . Smokeless tobacco: Never Used  Substance and Sexual Activity  . Alcohol use: No  . Drug use: No  . Sexual activity: Not on file    Comment: married  Lifestyle  . Physical activity:    Days per week: Not on file    Minutes per session: Not on file  . Stress: Not on file  Relationships  . Social connections:    Talks on phone: Not on file    Gets together: Not on file    Attends religious service: Not on file    Active member of club or organization: Not on file    Attends meetings of clubs or organizations: Not on file    Relationship status: Not on file  Other Topics Concern  . Not on file  Social History Narrative  . Not on file     Family History: The patient's family history includes Healthy in his son.  ROS:   Please see the  history of present illness.    ROS  All other systems reviewed and negative.   EKGs/Labs/Other Studies Reviewed:    The following studies were reviewed today: PAP download  EKG:  EKG is not ordered today.   Recent Labs: 11/08/2017: BUN 25; Creatinine, Ser 1.19; Hemoglobin 18.0; Platelets 234; Potassium 4.5; Sodium 144; TSH 2.420   Recent Lipid Panel    Component Value Date/Time   CHOL 190 11/08/2017 1513   TRIG 441 (H) 11/08/2017 1513   HDL 30 (L) 11/08/2017 1513   CHOLHDL 6.3 (H) 11/08/2017 1513   LDLCALC Comment 11/08/2017 1513    Physical Exam:    VS:  BP 128/74   Pulse (!) 101   Ht 5\' 11"  (1.803 m)   Wt (!) 316 lb (143.3 kg)   SpO2 92%   BMI 44.07 kg/m     Wt Readings from Last 3 Encounters:  05/16/18 (!) 316 lb (143.3 kg)  02/04/18 (!) 325 lb 12.8 oz (147.8 kg)  02/03/18 (!) 315 lb (142.9 kg)     GEN:  Well nourished, well developed in no acute distress HEENT: Normal NECK: No JVD; No carotid bruits LYMPHATICS: No lymphadenopathy CARDIAC: RRR, no murmurs, rubs, gallops RESPIRATORY:  Clear to auscultation without rales, wheezing or rhonchi  ABDOMEN: Soft, non-tender, non-distended MUSCULOSKELETAL:  No edema; No deformity  SKIN: Warm and dry NEUROLOGIC:  Alert and oriented x 3 PSYCHIATRIC:  Normal affect   ASSESSMENT:    1. OSA on CPAP   2. Benign essential HTN   3. Morbid obesity (HCC)    PLAN:    In order of problems listed above:  1.  OSA - the patient is tolerating PAP therapy well without any problems. The PAP download was reviewed today and showed an AHI of 5.4/hr on 13/9 cm H2O with 90% compliance in using more than 4 hours nightly.  The patient has been using and benefiting from PAP use and will continue to benefit from therapy.   2.  HTN -BP is adequately controlled on exam today.  He will continue on Maxide 37.5/25 mg daily, losartan 100 mg daily and Cardizem CD 360 mg daily.  3.  Morbid obesity - I have encouraged him to get into a routine  exercise program and cut back on carbs and portions.    Medication Adjustments/Labs and Tests Ordered: Current medicines are reviewed at length with the patient today.  Concerns regarding medicines are outlined above.  No orders of the  defined types were placed in this encounter.  No orders of the defined types were placed in this encounter.   Signed, Armanda Magic, MD  05/16/2018 2:00 PM    [facility]

## 2018-06-24 ENCOUNTER — Other Ambulatory Visit (HOSPITAL_COMMUNITY): Payer: Self-pay | Admitting: Internal Medicine

## 2018-07-04 DIAGNOSIS — G4733 Obstructive sleep apnea (adult) (pediatric): Secondary | ICD-10-CM | POA: Diagnosis not present

## 2018-07-12 DIAGNOSIS — E291 Testicular hypofunction: Secondary | ICD-10-CM | POA: Diagnosis not present

## 2018-07-21 DIAGNOSIS — R5383 Other fatigue: Secondary | ICD-10-CM | POA: Diagnosis not present

## 2018-07-21 DIAGNOSIS — G479 Sleep disorder, unspecified: Secondary | ICD-10-CM | POA: Diagnosis not present

## 2018-07-21 DIAGNOSIS — R718 Other abnormality of red blood cells: Secondary | ICD-10-CM | POA: Diagnosis not present

## 2018-07-21 DIAGNOSIS — E291 Testicular hypofunction: Secondary | ICD-10-CM | POA: Diagnosis not present

## 2018-07-25 DIAGNOSIS — G4733 Obstructive sleep apnea (adult) (pediatric): Secondary | ICD-10-CM | POA: Diagnosis not present

## 2018-08-22 ENCOUNTER — Encounter: Payer: Self-pay | Admitting: Internal Medicine

## 2018-08-22 ENCOUNTER — Ambulatory Visit (INDEPENDENT_AMBULATORY_CARE_PROVIDER_SITE_OTHER): Payer: BLUE CROSS/BLUE SHIELD | Admitting: Internal Medicine

## 2018-08-22 VITALS — BP 114/66 | HR 75 | Ht 71.0 in | Wt 280.4 lb

## 2018-08-22 DIAGNOSIS — I10 Essential (primary) hypertension: Secondary | ICD-10-CM

## 2018-08-22 DIAGNOSIS — E781 Pure hyperglyceridemia: Secondary | ICD-10-CM

## 2018-08-22 DIAGNOSIS — I480 Paroxysmal atrial fibrillation: Secondary | ICD-10-CM

## 2018-08-22 NOTE — Patient Instructions (Addendum)
Medication Instructions:  No Michael medications - adjust times for taking BP medicines as discussed.  Call with BP readings. If you need a refill on your cardiac medications before your next appointment, please call your pharmacy.   Lab work: Today : CBC, LIPIDS, A1C, BMET If you have labs (blood work) drawn today and your tests are completely normal, you will receive your results only by: Marland Kitchen. MyChart Message (if you have MyChart) OR . A paper copy in the mail If you have any lab test that is abnormal or we need to change your treatment, we will call you to review the results.  Testing/Procedures: none  Follow-Up: At [facility], you and your health needs are our priority.  As part of our continuing mission to provide you with exceptional heart care, we have created designated Provider Care Teams.  These Care Teams include your primary Cardiologist (physician) and Advanced Practice Providers (APPs -  Physician Assistants and Nurse Practitioners) who all work together to provide you with the care you need, when you need it. You will need a follow up appointment in:  6 months.  Please call our office 2 months in advance to schedule this appointment.  You may see Michael PatesPaula Ross, Michael Mcgee or one of the following Advanced Practice Providers on your designated Care Team: Michael NewcomerScott Weaver, Michael Vin [place], Michael Mcgee . Michael BonJanine Hammond, Michael Mcgee  Any Other Special Instructions Will Be Listed Below (If Applicable).

## 2018-08-22 NOTE — Progress Notes (Signed)
Cardiology Office Note   Date:  08/22/2018   ID:  Michael Mcgee, DOB 11/08/1960, MRN [id number]  PCP:  Michael IshiharaVaradarajan, Rupashree, MD  Cardiologist:   Michael PatesPaula Zhaire Locker, MD   F/u of HTN and atrial fib      History of Present Illness: Michael Mcgee is a 57 y.o. male with no known CAD  Does have a history of HTN, HL, atrial fibrillation   Echo with severe LVH I saw him last winter   At that time his bp was high   HR was up (SR) Recommm sleep eval and increased dilt to 360  Since seen he has had sleep eval with CPAP titration   He is tolerating well  He is now on Belviq for wt loss and he has lost 40 lbs   Says he feels much better   He is breathing much better       Did say he had phlebotomy for increased hgb    This is done through clinic where he gets testosterone   The dose of this has been decreased       Current Meds  Medication Sig  . clonazePAM (KLONOPIN) 2 MG tablet Take 2 mg by mouth at bedtime.  Marland Kitchen. diltiazem (CARDIZEM CD) 360 MG 24 hr capsule Take 1 capsule (360 mg total) by mouth daily.  Michael Mcgee. Icosapent Ethyl (VASCEPA) 1 g CAPS Take 2 capsules (2 g total) by mouth 2 (two) times daily.  . Lorcaserin HCl (BELVIQ) 10 MG TABS Take 10 mg by mouth 2 (two) times daily.  Marland Kitchen. losartan (COZAAR) 100 MG tablet Take 1 tablet (100 mg total) daily by mouth.  . testosterone cypionate (DEPOTESTOSTERONE CYPIONATE) 200 MG/ML injection Inject into the muscle every 14 (fourteen) days. .75 cc  . triamterene-hydrochlorothiazide (MAXZIDE-25) 37.5-25 MG tablet Take 1 tablet by mouth daily.  Michael Mcgee. XARELTO 20 MG TABS tablet TAKE 1 TABLET BY MOUTH EVERY DAY WITH SUPPER     Allergies:   Patient has no known allergies.   Past Medical History:  Diagnosis Date  . A-fib (HCC)   . H/O dislocation of shoulder   . History of morbid obesity   . Hypertension   . KNEE PAIN 09/09/2009   Qualifier: Diagnosis of  By: Michael Mcgee CMA, [place]    . Morbid obesity (HCC) 05/16/2018  . OSA on CPAP 05/16/2018   Severe obstructive sleep  apnea with an AHI of 61.9 with oxygen desaturations less than 89% for 1 hour 32 minutes and less than 80% for 14 minutes. Now on BiPAP at  13/9 cm H2O  . Palpitations   . Shoulder pain   . SOB (shortness of breath)     History reviewed. No pertinent surgical history.   Social History:  The patient  reports that he has never smoked. He has never used smokeless tobacco. He reports that he does not drink alcohol or use drugs.   Family History:  The patient's family history includes Healthy in his son.    ROS:  Please see the history of present illness. All other systems are reviewed and  Negative to the above problem except as noted.    PHYSICAL EXAM: VS:  BP 114/66   Pulse 75   Ht 5\' 11"  (1.803 m)   Wt 280 lb 6.4 oz (127.2 kg)   BMI 39.11 kg/m   GEN: Morbidly obese 57 yo , in no acute distress  HEENT: normal  Neck: JVP is not elevated     No carotid  bruits, or masses Cardiac: RRR; no murmurs, rubs, or gallops,no edema  Respiratory:  clear to auscultation bilaterally, normal work of breathing GI: soft, nontender, nondistended, + BS  No hepatomegaly  MS: no deformity Moving all extremities   Skin: warm and dry, no rash Neuro:  Strength and sensation are intact Psych: euthymic mood, full affect   EKG:  EKG is ordered today.  SR 75 bpm   Poss IWMI    Lipid Panel    Component Value Date/Time   CHOL 190 11/08/2017 1513   TRIG 441 (H) 11/08/2017 1513   HDL 30 (L) 11/08/2017 1513   CHOLHDL 6.3 (H) 11/08/2017 1513   LDLCALC Comment 11/08/2017 1513      Wt Readings from Last 3 Encounters:  08/22/18 280 lb 6.4 oz (127.2 kg)  05/16/18 (!) 316 lb (143.3 kg)  02/04/18 (!) 325 lb 12.8 oz (147.8 kg)      ASSESSMENT AND PLAN:  1  PAF  Remains in SR  Keep on current regimn   Check CBC    2  HTN  BP is much much better with CPAP and wt loss   Discussed moving losartan and maxzide dosing to am     May indeed need to taper back on those as his wt goes down   Told him to contact  us if drops and dizzy    3  Sleep apnea  Severe Continue CPAP   Tolerating   4  HL Check lipids  LIpids were much better in Aug  May be able to back off of meds    5  Heme   WIll check CBC   Testosterone may be boosting still   5   Morbid obesity  Doing well with wt loss  Check A1C      Current medicines are reviewed at length with the patient today.  The patient does not have concerns regarding medicines.  Signed, Michael Pates, MD  08/22/2018 11:12 AM    [facility] [address] [place], [place], [place]  [address] Phone: [phone]; Fax: [phone]

## 2018-08-23 ENCOUNTER — Telehealth: Payer: Self-pay | Admitting: *Deleted

## 2018-08-23 DIAGNOSIS — I10 Essential (primary) hypertension: Secondary | ICD-10-CM

## 2018-08-23 DIAGNOSIS — E781 Pure hyperglyceridemia: Secondary | ICD-10-CM

## 2018-08-23 LAB — CBC
Hematocrit: 49.5 % (ref 37.5–51.0)
Hemoglobin: 16.9 g/dL (ref 13.0–17.7)
MCH: 29 pg (ref 26.6–33.0)
MCHC: 34.1 g/dL (ref 31.5–35.7)
MCV: 85 fL (ref 79–97)
Platelets: 268 10*3/uL (ref 150–450)
RBC: 5.82 x10E6/uL — ABNORMAL HIGH (ref 4.14–5.80)
RDW: 13.3 % (ref 12.3–15.4)
WBC: 6.2 10*3/uL (ref 3.4–10.8)

## 2018-08-23 LAB — LIPID PANEL
Chol/HDL Ratio: 3.9 ratio (ref 0.0–5.0)
Cholesterol, Total: 128 mg/dL (ref 100–199)
HDL: 33 mg/dL — ABNORMAL LOW (ref >39)
LDL Calculated: 75 mg/dL (ref 0–99)
Triglycerides: 100 mg/dL (ref 0–149)
VLDL Cholesterol Cal: 20 mg/dL (ref 5–40)

## 2018-08-23 LAB — BASIC METABOLIC PANEL
BUN/Creatinine Ratio: 23 — ABNORMAL HIGH (ref 9–20)
BUN: 35 mg/dL — ABNORMAL HIGH (ref 6–24)
CO2: 21 mmol/L (ref 20–29)
Calcium: 9.6 mg/dL (ref 8.7–10.2)
Chloride: 103 mmol/L (ref 96–106)
Creatinine, Ser: 1.51 mg/dL — ABNORMAL HIGH (ref 0.76–1.27)
GFR calc Af Amer: 58 mL/min/{1.73_m2} — ABNORMAL LOW (ref >59)
GFR calc non Af Amer: 50 mL/min/{1.73_m2} — ABNORMAL LOW (ref >59)
Glucose: 98 mg/dL (ref 65–99)
Potassium: 4 mmol/L (ref 3.5–5.2)
Sodium: 140 mmol/L (ref 134–144)

## 2018-08-23 LAB — HEMOGLOBIN A1C
Est. average glucose Bld gHb Est-mCnc: 117 mg/dL
Hgb A1c MFr Bld: 5.7 % — ABNORMAL HIGH (ref 4.8–5.6)

## 2018-08-23 NOTE — Telephone Encounter (Signed)
Spoke with patient re: lab results/recommendations. Reviewed all labs.  Results forwarded to PCP per pt request. He does not take Vascepa.  I will remove from medication list. Scheduled lab appointment for in just over 3 weeks (pt will be out of town) He will work to stay well hydrated.

## 2018-08-23 NOTE — Telephone Encounter (Signed)
-----   Message from Pricilla RifflePaula Ross V, MD sent at 08/23/2018  9:38 AM EST ----- CBC is normal   Pt had phlebotomy due to testosterone use .    Dose backed down  Hgb A1C good at 5.7 Cr is mildly elevated    Make sure he is getting adequate fluids       REpeat BMET in 3 wks Triglycerides have come down with wt loss   100 now   LDL 75   HDL still a little low 33 REcomm:  Cut back on Vescepa to 1 tab bid   Confirm that this is what he is taking  REpeat lipids in 12 wks

## 2018-08-23 NOTE — Telephone Encounter (Signed)
Follow up    Patient is returning your call please call

## 2018-08-23 NOTE — Telephone Encounter (Signed)
Patient does take gemfibrozil twice daily.  It is not listed on his medication list. He is going to stop this today.  Plan for repeat lipids on 10/26/18.

## 2018-08-23 NOTE — Telephone Encounter (Signed)
Pt had pills   ? Gemfibrozil If taking that I would stop   Get lipids check in 8 to 10 wks

## 2018-08-23 NOTE — Telephone Encounter (Signed)
Left detailed message for patient to call back and let us know if he is taking Gemfibrozil.

## 2018-08-24 DIAGNOSIS — G4733 Obstructive sleep apnea (adult) (pediatric): Secondary | ICD-10-CM | POA: Diagnosis not present

## 2018-08-25 DIAGNOSIS — R634 Abnormal weight loss: Secondary | ICD-10-CM | POA: Diagnosis not present

## 2018-08-25 DIAGNOSIS — F419 Anxiety disorder, unspecified: Secondary | ICD-10-CM | POA: Diagnosis not present

## 2018-08-25 DIAGNOSIS — E785 Hyperlipidemia, unspecified: Secondary | ICD-10-CM | POA: Diagnosis not present

## 2018-09-16 ENCOUNTER — Other Ambulatory Visit: Payer: BLUE CROSS/BLUE SHIELD

## 2018-09-24 ENCOUNTER — Other Ambulatory Visit: Payer: Self-pay | Admitting: Internal Medicine

## 2018-09-24 DIAGNOSIS — G4733 Obstructive sleep apnea (adult) (pediatric): Secondary | ICD-10-CM | POA: Diagnosis not present

## 2018-10-24 DIAGNOSIS — Z6841 Body Mass Index (BMI) 40.0 and over, adult: Secondary | ICD-10-CM | POA: Diagnosis not present

## 2018-10-24 DIAGNOSIS — I10 Essential (primary) hypertension: Secondary | ICD-10-CM | POA: Diagnosis not present

## 2018-10-26 ENCOUNTER — Other Ambulatory Visit: Payer: BLUE CROSS/BLUE SHIELD | Admitting: *Deleted

## 2018-10-26 ENCOUNTER — Encounter (INDEPENDENT_AMBULATORY_CARE_PROVIDER_SITE_OTHER): Payer: Self-pay

## 2018-10-26 DIAGNOSIS — E781 Pure hyperglyceridemia: Secondary | ICD-10-CM | POA: Diagnosis not present

## 2018-10-26 LAB — LIPID PANEL
Chol/HDL Ratio: 4.2 ratio (ref 0.0–5.0)
Cholesterol, Total: 131 mg/dL (ref 100–199)
HDL: 31 mg/dL — ABNORMAL LOW (ref >39)
LDL Calculated: 76 mg/dL (ref 0–99)
Triglycerides: 120 mg/dL (ref 0–149)
VLDL Cholesterol Cal: 24 mg/dL (ref 5–40)

## 2018-10-31 ENCOUNTER — Ambulatory Visit (INDEPENDENT_AMBULATORY_CARE_PROVIDER_SITE_OTHER): Payer: BLUE CROSS/BLUE SHIELD | Admitting: Physician Assistant

## 2018-10-31 ENCOUNTER — Encounter: Payer: Self-pay | Admitting: Physician Assistant

## 2018-10-31 ENCOUNTER — Other Ambulatory Visit: Payer: Self-pay

## 2018-10-31 VITALS — BP 122/80 | HR 90 | Ht 71.0 in | Wt 280.8 lb

## 2018-10-31 DIAGNOSIS — N179 Acute kidney failure, unspecified: Secondary | ICD-10-CM

## 2018-10-31 DIAGNOSIS — I10 Essential (primary) hypertension: Secondary | ICD-10-CM

## 2018-10-31 DIAGNOSIS — E781 Pure hyperglyceridemia: Secondary | ICD-10-CM

## 2018-10-31 DIAGNOSIS — I480 Paroxysmal atrial fibrillation: Secondary | ICD-10-CM

## 2018-10-31 DIAGNOSIS — G4733 Obstructive sleep apnea (adult) (pediatric): Secondary | ICD-10-CM | POA: Diagnosis not present

## 2018-10-31 DIAGNOSIS — Z9989 Dependence on other enabling machines and devices: Secondary | ICD-10-CM

## 2018-10-31 NOTE — Patient Instructions (Addendum)
Medication Instructions:  Your physician recommends that you continue on your current medications as directed. Please refer to the Current Medication list given to you today.  If you need a refill on your cardiac medications before your next appointment, please call your pharmacy.   Lab work: TODAY:  BMET (PREVIOUSLY ORDERED)  If you have labs (blood work) drawn today and your tests are completely normal, you will receive your results only by: Marland Kitchen MyChart Message (if you have MyChart) OR . A paper copy in the mail If you have any lab test that is abnormal or we need to change your treatment, we will call you to review the results.  Testing/Procedures: None ordered  Follow-Up: At [facility], you and your health needs are our priority.  As part of our continuing mission to provide you with exceptional heart care, we have created designated Provider Care Teams.  These Care Teams include your primary Cardiologist (physician) and Advanced Practice Providers (APPs -  Physician Assistants and Nurse Practitioners) who all work together to provide you with the care you need, when you need it. You will need a follow up appointment in:  12 months.  Please call our office 2 months in advance to schedule this appointment.  You may see Dietrich Pates, MD or one of the following Advanced Practice Providers on your designated Care Team: Tereso Newcomer, PA-C [place], [place] . Berton Bon, NP  Any Other Special Instructions Will Be Listed Below (If Applicable).

## 2018-10-31 NOTE — Progress Notes (Signed)
Cardiology Office Note    Date:  10/31/2018   ID:  Michael Mcgee, DOB 30-Jan-1961, MRN [id number]  PCP:  Lorenda Ishihara, MD  Cardiologist:  Dr. Tenny Craw Dr. Mayford Knife: OSA  Chief Complaint: Lab discussion   History of Present Illness:   Michael Mcgee is a 58 y.o. male HTN, HLD, PAF on OSA on CPAP presents for lab discussion.   He has been off gemfibrozil for many months. Controlling cholesterol  With diet and exercise. 10/26/2018: Cholesterol, Total 131; HDL 31; LDL Calculated 76; Triglycerides 120. He does weight lifting and running. The patient denies nausea, vomiting, fever, chest pain, palpitations, shortness of breath, orthopnea, PND, dizziness, syncope, cough, congestion, abdominal pain, hematochezia, melena, lower extremity edema.  Past Medical History:  Diagnosis Date  . A-fib (HCC)   . H/O dislocation of shoulder   . History of morbid obesity   . Hypertension   . KNEE PAIN 09/09/2009   Qualifier: Diagnosis of  By: Christell Constant CMA, [place]    . Morbid obesity (HCC) 05/16/2018  . OSA on CPAP 05/16/2018   Severe obstructive sleep apnea with an AHI of 61.9 with oxygen desaturations less than 89% for 1 hour 32 minutes and less than 80% for 14 minutes. Now on BiPAP at  13/9 cm H2O  . Palpitations   . Shoulder pain   . SOB (shortness of breath)     No past surgical history on file.  Current Medications: Prior to Admission medications   Medication Sig Start Date End Date Taking? Authorizing Provider  clonazePAM (KLONOPIN) 2 MG tablet Take 2 mg by mouth at bedtime.    [provider]  diltiazem (CARDIZEM CD) 360 MG 24 hr capsule TAKE 1 CAPSULE (360 MG TOTAL) BY MOUTH DAILY. 09/27/18   Pricilla Riffle, MD  Icosapent Ethyl (VASCEPA) 1 g CAPS Take 2 capsules (2 g total) by mouth 2 (two) times daily. Patient not taking: Reported on 08/23/2018 11/12/17   Pricilla Riffle, MD  Lorcaserin HCl (BELVIQ) 10 MG TABS Take 10 mg by mouth 2 (two) times daily.    [provider]  losartan  (COZAAR) 100 MG tablet Take 1 tablet (100 mg total) daily by mouth. 07/22/17   Pricilla Riffle, MD  testosterone cypionate (DEPOTESTOSTERONE CYPIONATE) 200 MG/ML injection Inject into the muscle every 14 (fourteen) days. .75 cc    [provider]  triamterene-hydrochlorothiazide (MAXZIDE-25) 37.5-25 MG tablet Take 1 tablet by mouth daily. 02/04/18   Pricilla Riffle, MD  XARELTO 20 MG TABS tablet TAKE 1 TABLET BY MOUTH EVERY DAY WITH SUPPER 06/24/18   Pricilla Riffle, MD    Allergies:   Patient has no known allergies.   Social History   Socioeconomic History  . Marital status: Married    Spouse name: Not on file  . Number of children: Not on file  . Years of education: Not on file  . Highest education level: Not on file  Occupational History  . Not on file  Social Needs  . Financial resource strain: Not on file  . Food insecurity:    Worry: Not on file    Inability: Not on file  . Transportation needs:    Medical: Not on file    Non-medical: Not on file  Tobacco Use  . Smoking status: Never Smoker  . Smokeless tobacco: Never Used  Substance and Sexual Activity  . Alcohol use: No  . Drug use: No  . Sexual activity: Not on file  Comment: married  Lifestyle  . Physical activity:    Days per week: Not on file    Minutes per session: Not on file  . Stress: Not on file  Relationships  . Social connections:    Talks on phone: Not on file    Gets together: Not on file    Attends religious service: Not on file    Active member of club or organization: Not on file    Attends meetings of clubs or organizations: Not on file    Relationship status: Not on file  Other Topics Concern  . Not on file  Social History Narrative  . Not on file     Family History:  The patient's family history includes Healthy in his son.   ROS:   Please see the history of present illness.    ROS All other systems reviewed and are negative.   PHYSICAL EXAM:   VS:  BP 122/80   Pulse 90    Ht  (1.803 m)   Wt 280 lb 12.8 oz (127.4 kg)   SpO2 93%   BMI 39.16 kg/m    GEN: Well nourished, well developed, in no acute distress  HEENT: normal  Neck: no JVD, carotid bruits, or masses Cardiac: RRR; no murmurs, rubs, or gallops,no edema  Respiratory:  clear to auscultation bilaterally, normal work of breathing GI: soft, nontender, nondistended, + BS MS: no deformity or atrophy  Skin: warm and dry, no rash Neuro:  Alert and Oriented x 3, Strength and sensation are intact Psych: euthymic mood, full affect  Wt Readings from Last 3 Encounters:  10/31/18 280 lb 12.8 oz (127.4 kg)  08/22/18 280 lb 6.4 oz (127.2 kg)  05/16/18 (!) 316 lb (143.3 kg)      Studies/Labs Reviewed:   EKG:  EKG is not ordered today.   Recent Labs: 11/08/2017: TSH 2.420 08/22/2018: BUN 35; Creatinine, Ser 1.51; Hemoglobin 16.9; Platelets 268; Potassium 4.0; Sodium 140   Lipid Panel    Component Value Date/Time   CHOL 131 10/26/2018 1018   TRIG 120 10/26/2018 1018   HDL 31 (L) 10/26/2018 1018   CHOLHDL 4.2 10/26/2018 1018   LDLCALC 76 10/26/2018 1018    Additional studies/ records that were reviewed today include:  As above    ASSESSMENT & PLAN:   1. PAF - Sinus by exam. No palpitations or syncope. Continue Cardizem and Xarelto. No bleeding issue.   2. HTN - BP stable on current medications.   3. OSA - Compliant with CPAP  4. AKI - BUN/SCR was 35/1.51 on 08/2018. Will check lab today.   Medication Adjustments/Labs and Tests Ordered: Current medicines are reviewed at length with the patient today.  Concerns regarding medicines are outlined above.  Medication changes, Labs and Tests ordered today are listed in the Patient Instructions below. Patient Instructions  Medication Instructions:  Your physician recommends that you continue on your current medications as directed. Please refer to the Current Medication list given to you today.  If you need a refill on your cardiac  medications before your next appointment, please call your pharmacy.   Lab work: TODAY:  BMET (PREVIOUSLY ORDERED)  If you have labs (blood work) drawn today and your tests are completely normal, you will receive your results only by: Marland Kitchen MyChart Message (if you have MyChart) OR . A paper copy in the mail If you have any lab test that is abnormal or we need to change your treatment, we will call  you to review the results.  Testing/Procedures: None ordered  Follow-Up: At [facility], you and your health needs are our priority.  As part of our continuing mission to provide you with exceptional heart care, we have created designated Provider Care Teams.  These Care Teams include your primary Cardiologist (physician) and Advanced Practice Providers (APPs -  Physician Assistants and Nurse Practitioners) who all work together to provide you with the care you need, when you need it. You will need a follow up appointment in:  12 months.  Please call our office 2 months in advance to schedule this appointment.  You may see Dietrich Pates, MD or one of the following Advanced Practice Providers on your designated Care Team: Tereso Newcomer, PA-C [place], [place] . Berton Bon, NP  Any Other Special Instructions Will Be Listed Below (If Applicable).       Lorelei Pont, [place]  10/31/2018 3:43 PM    [facility] [address] [place], [place], [place]  [address] Phone: [phone]; Fax: [phone]

## 2018-11-01 LAB — BASIC METABOLIC PANEL
BUN/Creatinine Ratio: 24 — ABNORMAL HIGH (ref 9–20)
BUN: 30 mg/dL — ABNORMAL HIGH (ref 6–24)
CO2: 22 mmol/L (ref 20–29)
Calcium: 9 mg/dL (ref 8.7–10.2)
Chloride: 105 mmol/L (ref 96–106)
Creatinine, Ser: 1.24 mg/dL (ref 0.76–1.27)
GFR calc Af Amer: 74 mL/min/{1.73_m2} (ref >59)
GFR calc non Af Amer: 64 mL/min/{1.73_m2} (ref >59)
Glucose: 95 mg/dL (ref 65–99)
Potassium: 4.5 mmol/L (ref 3.5–5.2)
Sodium: 143 mmol/L (ref 134–144)

## 2018-12-25 ENCOUNTER — Other Ambulatory Visit (HOSPITAL_COMMUNITY): Payer: Self-pay | Admitting: Internal Medicine

## 2018-12-26 NOTE — Telephone Encounter (Signed)
SCr 1.24 on 10/31/18, age 58, weight 127kg, CrCL 115

## 2019-03-13 ENCOUNTER — Other Ambulatory Visit: Payer: Self-pay | Admitting: Internal Medicine

## 2019-05-22 ENCOUNTER — Other Ambulatory Visit (HOSPITAL_COMMUNITY): Payer: Self-pay | Admitting: Internal Medicine

## 2019-05-22 NOTE — Telephone Encounter (Signed)
33m 127.4kg Scr 1.24 10/31/18 Lovw/bhagat 10/31/18 ccr 115.6

## 2019-05-22 NOTE — Telephone Encounter (Signed)
Xarelto 20mg  refill request received; pt is 58 years old, weight-127.4kg, Crea-1.24 on 10/31/2018, last seen by Vin on 10/31/2018, Diagnosis-Afib, CrCl-115.61ml/min ; Dose is appropriate based on dosing criteria. Will send in refill to requested pharmacy.

## 2019-06-26 ENCOUNTER — Other Ambulatory Visit: Payer: Self-pay | Admitting: Internal Medicine

## 2019-07-22 ENCOUNTER — Other Ambulatory Visit: Payer: Self-pay | Admitting: Internal Medicine

## 2019-10-22 ENCOUNTER — Other Ambulatory Visit: Payer: Self-pay | Admitting: Internal Medicine

## 2019-10-22 ENCOUNTER — Other Ambulatory Visit (HOSPITAL_COMMUNITY): Payer: Self-pay | Admitting: Physician Assistant

## 2019-10-23 NOTE — Telephone Encounter (Signed)
Pt saw Chelsea Aus, PA on 10/31/18, pt has appt with Dr Tenny Craw scheduled for 11/03/19.  Last labs 07/25/19 Creat 1.22 at [facility] per [facility], age 59, weight 127.4, CrCl 117/48, based on CrCl pt is on appropriate dosage of Xarelto 20mg  QD.  Will refill rx.

## 2019-11-03 ENCOUNTER — Telehealth: Payer: Self-pay | Admitting: *Deleted

## 2019-11-03 ENCOUNTER — Ambulatory Visit: Payer: BLUE CROSS/BLUE SHIELD | Admitting: Internal Medicine

## 2019-11-03 NOTE — Telephone Encounter (Signed)
Received a call from checkout that patient had come upstairs to our waiting room bypassing downstairs lobby registration.  Was here for appointment but not marked present or registered.  None of the staff was aware he was here.  He has been rescheduled and needs med refill.    I called him.  He confirmed medications and that he does not need refills at this time.  Rescheduled appt to 3/12 with Dr. Tenny Craw.

## 2019-11-17 ENCOUNTER — Ambulatory Visit (INDEPENDENT_AMBULATORY_CARE_PROVIDER_SITE_OTHER): Payer: Managed Care, Other (non HMO) | Admitting: Internal Medicine

## 2019-11-17 ENCOUNTER — Encounter: Payer: Self-pay | Admitting: Internal Medicine

## 2019-11-17 ENCOUNTER — Other Ambulatory Visit: Payer: Self-pay

## 2019-11-17 VITALS — BP 108/76 | HR 65 | Ht 71.0 in | Wt 309.1 lb

## 2019-11-17 DIAGNOSIS — I517 Cardiomegaly: Secondary | ICD-10-CM | POA: Diagnosis not present

## 2019-11-17 NOTE — Patient Instructions (Signed)
Medication Instructions:  No changes *If you need a refill on your cardiac medications before your next appointment, please call your pharmacy*   Lab Work: none If you have labs (blood work) drawn today and your tests are completely normal, you will receive your results only by: Marland Kitchen MyChart Message (if you have MyChart) OR . A paper copy in the mail If you have any lab test that is abnormal or we need to change your treatment, we will call you to review the results.   Testing/Procedures:  Will try to schedule for first week in April Your physician has requested that you have a cardiac MRI. Cardiac MRI uses a computer to create images of your heart as its beating, producing both still and moving pictures of your heart and major blood vessels. For further information please visit [email or web address]. Please follow the instruction sheet given to you today for more information.   Follow-Up: At [facility], you and your health needs are our priority.  As part of our continuing mission to provide you with exceptional heart care, we have created designated Provider Care Teams.  These Care Teams include your primary Cardiologist (physician) and Advanced Practice Providers (APPs -  Physician Assistants and Nurse Practitioners) who all work together to provide you with the care you need, when you need it.  We recommend signing up for the patient portal called "MyChart".  Sign up information is provided on this After Visit Summary.  MyChart is used to connect with patients for Virtual Visits (Telemedicine).  Patients are able to view lab/test results, encounter notes, upcoming appointments, etc.  Non-urgent messages can be sent to your provider as well.   To learn more about what you can do with MyChart, go to [email or web address].    Your next appointment:   9 month(s)  The format for your next appointment:   In Person  Provider:   You may see Dietrich Pates, MD or one of the following Advanced  Practice Providers on your designated Care Team:    Tereso Newcomer, PA-C  [place], [place]  Berton Bon, NP    Other Instructions

## 2019-11-17 NOTE — Progress Notes (Signed)
Cardiology Office Note   Date:  11/17/2019   ID:  Michael Mcgee, DOB [birth year], MRN [id number]  PCP:  Leeroy Cha, MD  Cardiologist:   Dorris Carnes, MD   F/u of HTN and atrial fib     History of Present Illness: Michael Mcgee is a 59 y.o. male with history of HTN, HL, atrial fibrillation   Echo in 2018 with severe LVH   Lexiscan myovue in 2018 showed no ischemia   I last saw him in clinic in 2019   He was on Bel[place]q at time, losing weight  Since I saw him he is now off of [place]  Not able to get  He is active wt lifting   Denies SOB  No CP  No dizziness  Current Meds  Medication Sig  . clonazePAM (KLONOPIN) 2 MG tablet Take 2 mg by mouth at bedtime.  Marland Kitchen diltiazem (CARDIZEM CD) 360 MG 24 hr capsule Take 1 capsule (360 mg total) by mouth daily. Please keep upcoming appt in February with Dr. Meda Coffee before anymore refills. Thank you  . gemfibrozil (LOPID) 600 MG tablet Take 600 mg by mouth 2 (two) times daily before a meal.   . Icosapent Ethyl (VASCEPA) 1 g CAPS Take 2 capsules (2 g total) by mouth 2 (two) times daily.  Marland Kitchen losartan (COZAAR) 100 MG tablet Take 1 tablet (100 mg total) daily by mouth.  . Melatonin 10 MG CAPS Take 10 mg by mouth at bedtime.  Marland Kitchen testosterone cypionate (DEPOTESTOSTERONE CYPIONATE) 200 MG/ML injection Inject into the muscle every 14 (fourteen) days. .75 cc  . triamterene-hydrochlorothiazide (MAXZIDE-25) 37.5-25 MG tablet Take 1 tablet by mouth daily. Please keep upcoming appt in February with Dr. Meda Coffee before anymore refills. Thank you  . XARELTO 20 MG TABS tablet TAKE 1 TABLET BY MOUTH EVERY DAY WITH SUPPER     Allergies:   Patient has no known allergies.   Past Medical History:  Diagnosis Date  . A-fib ([place])   . H/O dislocation of shoulder   . History of morbid obesity   . Hypertension   . KNEE PAIN 09/09/2009   Qualifier: Diagnosis of  By: Laurance Flatten CMA, [place]    . Morbid obesity ([place]) 05/16/2018  . OSA on CPAP 05/16/2018   Severe obstructive  sleep apnea with an AHI of 61.9 with oxygen desaturations less than 89% for 1 hour 32 minutes and less than 80% for 14 minutes. Now on BiPAP at  13/9 cm H2O  . Palpitations   . Shoulder pain   . SOB (shortness of breath)     No past surgical history on file.   Social History:  The patient  reports that he has never smoked. He has never used smokeless tobacco. He reports that he does not drink alcohol or use drugs.   Family History:  The patient's family history includes Healthy in his son.    ROS:  Please see the history of present illness. All other systems are reviewed and  Negative to the above problem except as noted.    PHYSICAL EXAM: VS:  BP 108/76   Pulse 65   Ht 5\' 11"  (1.803 m)   Wt (!) 309 lb 1.9 oz (140.2 kg)   BMI 43.11 kg/m   GEN: Morbidly obese 59 yo , in no acute distress  HEENT: normal  Neck: JVP is not elevated     No carotid bruits, Cardiac: RRR; no murmurs, rubs, or gallops,no edema  Respiratory:  clear to  auscultation bilaterally, normal work of breathing GI: soft, nontender, nondistended, + BS  No hepatomegaly  MS: no deformity Moving all extremities   Skin: warm and dry, no rash Neuro:  Strength and sensation are intact Psych: euthymic mood, full affect   EKG:  EKG is ordered today.  SR 65 bpm  Lipid Panel    Component Value Date/Time   CHOL 131 10/26/2018 1018   TRIG 120 10/26/2018 1018   HDL 31 (L) 10/26/2018 1018   CHOLHDL 4.2 10/26/2018 1018   LDLCALC 76 10/26/2018 1018      Wt Readings from Last 3 Encounters:  11/17/19 (!) 309 lb 1.9 oz (140.2 kg)  10/31/18 280 lb 12.8 oz (127.4 kg)  08/22/18 280 lb 6.4 oz (127.2 kg)      ASSESSMENT AND PLAN:  1  HTN  BP is controlled. Improved signif when CPAP added in past  Keep on same meds   2   Cardiac hypertrophy   Severe  On echo in past  Will set up for cardiac MR to further define  23  Hx PAF  Remains in SR   Keep on anticoagualtion   4 OSA  Continue CPAP  5   Heme   Hgb in 11/20  was 17.1   Follow   6  Morbid obesity   Wt came back.  Not on Rx  Encoruaged continue exercise, diet  Plan for f/u next winter    Current medicines are reviewed at length with the patient today.  The patient does not have concerns regarding medicines.  Signed, Dietrich Pates, MD  11/17/2019 4:43 PM    [facility] [address] [place], [place], [place]  [address] Phone: [phone]; Fax: [phone]

## 2019-11-22 NOTE — Addendum Note (Signed)
Addended by: Lendon Ka on: 11/22/2019 02:02 PM   Modules accepted: Orders

## 2019-11-23 ENCOUNTER — Telehealth: Payer: Self-pay | Admitting: Internal Medicine

## 2019-11-23 NOTE — Telephone Encounter (Signed)
Can Rx Xanax  0.25 x 4 tablets

## 2019-11-23 NOTE — Telephone Encounter (Signed)
I called this patient is discuss scheduling optoins for the cardiac mri ordered by Dr. Tenny Craw.  Patient states he will need something to calm his anxiety the day of his appointment.  Please advise.

## 2019-11-24 MED ORDER — ALPRAZOLAM 0.25 MG PO TABS: ORAL_TABLET | ORAL | 0 refills | Status: DC

## 2019-11-24 NOTE — Telephone Encounter (Signed)
Follow Up: ° ° ° °Returning your call from this morning. °

## 2019-11-24 NOTE — Telephone Encounter (Signed)
Left message for patient to call back (self-identified VM) re: anti anxiety med to be prescribed prior to cardiac MRI.  He will need a driver for transportation to/from the study.

## 2019-11-24 NOTE — Telephone Encounter (Signed)
Patient instructed per Dr. Tenny Craw to take two 0.25 mg tablets Xanax an hour prior to MRI and take the other 2 tablets with him in case he would need additional.  He will have a driver. Xanax .025 mg called into pharmacy.

## 2019-11-27 ENCOUNTER — Other Ambulatory Visit: Payer: Self-pay | Admitting: *Deleted

## 2019-11-27 DIAGNOSIS — Z01812 Encounter for preprocedural laboratory examination: Secondary | ICD-10-CM

## 2019-11-29 ENCOUNTER — Encounter: Payer: Self-pay | Admitting: Internal Medicine

## 2019-11-29 ENCOUNTER — Telehealth: Payer: Self-pay | Admitting: Internal Medicine

## 2019-11-29 NOTE — Telephone Encounter (Signed)
Spoke with patient regarding appointment for Cardiac MRI scheduled Monday 12/11/19 at [facility]--arrival time  Is 8:15 am 1st floor radiology.  Will mail information to patient.

## 2019-11-29 NOTE — Telephone Encounter (Signed)
Follow Up  Patient is calling back to speak with Joyce Gross. Transferred call to [facility].

## 2019-11-30 ENCOUNTER — Ambulatory Visit: Payer: Managed Care, Other (non HMO) | Admitting: Internal Medicine

## 2019-12-07 ENCOUNTER — Telehealth: Payer: Self-pay | Admitting: Internal Medicine

## 2019-12-07 NOTE — Telephone Encounter (Signed)
Echo in 2018 showed severe LVH Will route to billing and Dr. Tenny Craw for review.

## 2019-12-07 NOTE — Telephone Encounter (Signed)
Spoke with patient regarding appointment for Cardiac MRI scheduled Monday 12/11/19---insurance has denied coverage stating patient needs Echo first---Please advise.

## 2019-12-11 ENCOUNTER — Ambulatory Visit (HOSPITAL_COMMUNITY): Payer: Managed Care, Other (non HMO)

## 2019-12-18 ENCOUNTER — Telehealth: Payer: Self-pay | Admitting: Internal Medicine

## 2019-12-18 NOTE — Telephone Encounter (Signed)
Patient requesting to have Dr. Charlott Rakes nurse give him a call. He states he was expecting a call last week in regards to setting up his echo. He had spring break last week and now will have a hard time working around his schedule to schedule the echo.

## 2019-12-22 NOTE — Telephone Encounter (Signed)
Spoke with patient. Adv there is a peer to peer review on 4/19 re: MRI.

## 2019-12-25 ENCOUNTER — Telehealth: Payer: Self-pay | Admitting: *Deleted

## 2019-12-25 ENCOUNTER — Other Ambulatory Visit: Payer: Self-pay | Admitting: *Deleted

## 2019-12-25 DIAGNOSIS — I517 Cardiomegaly: Secondary | ICD-10-CM

## 2019-12-25 NOTE — Telephone Encounter (Signed)
Called patient and let him know that Insurance is requiring  Echo before MRI.  Dr. Tenny Craw had peer to peer review with them today.  Echo order placed.  Pt reports that over the last two weeks he has been having dizzy spells that happen when he goes from sitting to standing or after he stands and begins walking.  He wonders if it is a virus, feels slight flu like symptoms.   Had covid vaccines, does not wish to be testing for Covid at this time.   Under a lot of stress with family issues.  His vision does not change and he feels slightly like he may pass out but this is very brief.  He said it feels like his heart is trying to compensate --it pounds briefly during the dizzy spell.  Discussed hydration and blood pressure changes.  He will try to check it at home, sitting and then standing after a minute or two. No other symptoms.  Doing his usual, very strenuous work outs.  Worried that he has a blocked artery "widowmaker" because he's 43, male.  Aware to make and keep echo appointment and that I would forward to Dr. Tenny Craw to let her know his concerns.

## 2020-01-16 ENCOUNTER — Ambulatory Visit (HOSPITAL_COMMUNITY): Payer: Managed Care, Other (non HMO) | Attending: Cardiovascular Disease

## 2020-01-16 ENCOUNTER — Other Ambulatory Visit: Payer: Self-pay

## 2020-01-16 DIAGNOSIS — I517 Cardiomegaly: Secondary | ICD-10-CM | POA: Insufficient documentation

## 2020-03-06 ENCOUNTER — Other Ambulatory Visit: Payer: Self-pay

## 2020-03-06 ENCOUNTER — Telehealth (INDEPENDENT_AMBULATORY_CARE_PROVIDER_SITE_OTHER): Payer: Managed Care, Other (non HMO) | Admitting: Cardiology

## 2020-03-06 VITALS — Ht 71.0 in | Wt 295.0 lb

## 2020-03-06 DIAGNOSIS — G4733 Obstructive sleep apnea (adult) (pediatric): Secondary | ICD-10-CM

## 2020-03-06 DIAGNOSIS — I10 Essential (primary) hypertension: Secondary | ICD-10-CM

## 2020-03-06 NOTE — Addendum Note (Signed)
Addended by: Gunnar Fusi A on: 03/06/2020 11:37 AM   Modules accepted: Orders

## 2020-03-06 NOTE — Progress Notes (Signed)
  Patient Consent for Virtual Visit         Michael Mcgee has provided verbal consent on 03/06/2020 for a virtual visit (video or telephone).   CONSENT FOR VIRTUAL VISIT FOR:  Michael Mcgee  By participating in this virtual visit I agree to the following:  I hereby voluntarily request, consent and authorize [facility] and its employed or contracted physicians, physician assistants, nurse practitioners or other licensed health care professionals (the Practitioner), to provide me with telemedicine health care services (the "Services") as deemed necessary by the treating Practitioner. I acknowledge and consent to receive the Services by the Practitioner via telemedicine. I understand that the telemedicine visit will involve communicating with the Practitioner through live audiovisual communication technology and the disclosure of certain medical information by electronic transmission. I acknowledge that I have been given the opportunity to request an in-person assessment or other available alternative prior to the telemedicine visit and am voluntarily participating in the telemedicine visit.  I understand that I have the right to withhold or withdraw my consent to the use of telemedicine in the course of my care at any time, without affecting my right to future care or treatment, and that the Practitioner or I may terminate the telemedicine visit at any time. I understand that I have the right to inspect all information obtained and/or recorded in the course of the telemedicine visit and may receive copies of available information for a reasonable fee.  I understand that some of the potential risks of receiving the Services via telemedicine include:  Marland Kitchen Delay or interruption in medical evaluation due to technological equipment failure or disruption; . Information transmitted may not be sufficient (e.g. poor resolution of images) to allow for appropriate medical decision making by the Practitioner;  and/or  . In rare instances, security protocols could fail, causing a breach of personal health information.  Furthermore, I acknowledge that it is my responsibility to provide information about my medical history, conditions and care that is complete and accurate to the best of my ability. I acknowledge that Practitioner's advice, recommendations, and/or decision may be based on factors not within their control, such as incomplete or inaccurate data provided by me or distortions of diagnostic images or specimens that may result from electronic transmissions. I understand that the practice of medicine is not an exact science and that Practitioner makes no warranties or guarantees regarding treatment outcomes. I acknowledge that a copy of this consent can be made available to me via my patient portal [facility] MyChart), or I can request a printed copy by calling the office of [facility].    I understand that my insurance will be billed for this visit.   I have read or had this consent read to me. . I understand the contents of this consent, which adequately explains the benefits and risks of the Services being provided via telemedicine.  . I have been provided ample opportunity to ask questions regarding this consent and the Services and have had my questions answered to my satisfaction. . I give my informed consent for the services to be provided through the use of telemedicine in my medical care

## 2020-03-06 NOTE — Progress Notes (Signed)
Virtual Visit via Telephone Note   This visit type was conducted due to national recommendations for restrictions regarding the COVID-19 Pandemic (e.g. social distancing) in an effort to limit this patient's exposure and mitigate transmission in our community.  Due to his co-morbid illnesses, this patient is at least at moderate risk for complications without adequate follow up.  This format is felt to be most appropriate for this patient at this time.  The patient did not have access to video technology/had technical difficulties with video requiring transitioning to audio format only (telephone).  All issues noted in this document were discussed and addressed.  No physical exam could be performed with this format.  Please refer to the patient's chart for his  consent to telehealth for [facility].  Evaluation Performed:  Follow-up visit  This visit type was conducted due to national recommendations for restrictions regarding the COVID-19 Pandemic (e.g. social distancing).  This format is felt to be most appropriate for this patient at this time.  All issues noted in this document were discussed and addressed.  No physical exam was performed (except for noted visual exam findings with Video Visits).  Please refer to the patient's chart (MyChart message for video visits and phone note for telephone visits) for the patient's consent to telehealth for [facility].  Date:  03/06/2020   ID:  Michael Mcgee, DOB 23-Feb-1961, MRN [id number]  Patient Location:  Home  Provider location:  [place]  PCP:  Lorenda Ishihara, MD  Cardiologist:  Dietrich Pates, MD  Sleep Medicine:  Armanda Magic, MD Electrophysiologist:  None   Chief Complaint:  OSA  History of Present Illness:    Michael Mcgee is a 59 y.o. male who presents via audio/video conferencing for a telehealth visit today.     Michael Mcgee is a 59 y.o. male with a hx of hypertension, hyperlipidemia and atrial fibrillation who was  referred by Dr. Dietrich Pates for sleep study due to atrial fibrillation and hypertension.  He underwent home sleep study showing severe obstructive sleep apnea with an AHI of 61.9 with oxygen desaturations less than 89% for 1 hour 32 minutes and less than 80% for 14 minutes.  He underwent CPAP titration but failed to adequately titrate due to ongoing respiratory events and was referred for BiPAP titration.  He underwent BiPAP titration of 13/9 centimeters H2O.  He is doing well with his CPAP device and thinks that he has gotten used to it.  He tolerates the mask and feels the pressure is adequate.  Since going on CPAP he feels rested in the am and has no significant daytime sleepiness.  He denies any significant mouth or nasal dryness or nasal congestion.  He does not think that he snores.     The patient does not have symptoms concerning for COVID-19 infection (fever, chills, cough, or new shortness of breath).    Prior CV studies:   The following studies were reviewed today:  PAP compliance download  Past Medical History:  Diagnosis Date  . A-fib ([facility])   . H/O dislocation of shoulder   . History of morbid obesity   . Hypertension   . KNEE PAIN 09/09/2009   Qualifier: Diagnosis of  By: Christell Constant CMA, [place]    . Morbid obesity ([facility]) 05/16/2018  . OSA on CPAP 05/16/2018   Severe obstructive sleep apnea with an AHI of 61.9 with oxygen desaturations less than 89% for 1 hour 32 minutes and less than 80% for 14  minutes. Now on BiPAP at  13/9 cm H2O  . Palpitations   . Shoulder pain   . SOB (shortness of breath)    No past surgical history on file.   Current Meds  Medication Sig  . clonazePAM (KLONOPIN) 2 MG tablet Take 2 mg by mouth at bedtime.  Marland Kitchen diltiazem (CARDIZEM CD) 360 MG 24 hr capsule Take 1 capsule (360 mg total) by mouth daily. Please keep upcoming appt in February with Dr. Delton See before anymore refills. Thank you  . gemfibrozil (LOPID) 600 MG tablet Take 600 mg by mouth 2 (two) times  daily before a meal.   . guaiFENesin 200 MG tablet Take 200 mg by mouth at bedtime.  Marland Kitchen losartan (COZAAR) 100 MG tablet Take 1 tablet (100 mg total) daily by mouth.  . Melatonin 10 MG CAPS Take 10 mg by mouth at bedtime.  Marland Kitchen testosterone cypionate (DEPOTESTOSTERONE CYPIONATE) 200 MG/ML injection Inject into the muscle once a week. .75 cc   . triamterene-hydrochlorothiazide (MAXZIDE-25) 37.5-25 MG tablet Take 1 tablet by mouth daily. Please keep upcoming appt in February with Dr. Delton See before anymore refills. Thank you  . XARELTO 20 MG TABS tablet TAKE 1 TABLET BY MOUTH EVERY DAY WITH SUPPER     Allergies:   Patient has no known allergies.   Social History   Tobacco Use  . Smoking status: Never Smoker  . Smokeless tobacco: Never Used  Vaping Use  . Vaping Use: Never used  Substance Use Topics  . Alcohol use: No  . Drug use: No     Family Hx: The patient's family history includes Healthy in his son.  ROS:   Please see the history of present illness.     All other systems reviewed and are negative.   Labs/Other Tests and Data Reviewed:    Recent Labs: No results found for requested labs within last 8760 hours.   Recent Lipid Panel Lab Results  Component Value Date/Time   CHOL 131 10/26/2018 10:18 AM   TRIG 120 10/26/2018 10:18 AM   HDL 31 (L) 10/26/2018 10:18 AM   CHOLHDL 4.2 10/26/2018 10:18 AM   LDLCALC 76 10/26/2018 10:18 AM    Wt Readings from Last 3 Encounters:  03/06/20 295 lb (133.8 kg)  11/17/19 (!) 309 lb 1.9 oz (140.2 kg)  10/31/18 280 lb 12.8 oz (127.4 kg)     Objective:    Vital Signs:  Ht 5\' 11"  (1.803 m)   Wt 295 lb (133.8 kg)   BMI 41.14 kg/m     ASSESSMENT & PLAN:    1.  OSA -  The patient is tolerating PAP therapy well without any problems. The PAP download was reviewed today and showed an AHI of 4.7/hr on 13/9 cm H2O with 100% compliance in using more than 4 hours nightly.  The patient has been using and benefiting from PAP use and will  continue to benefit from therapy.   2.  HTN -continue Maxide 37.5/25mg  daily, Losartan 100mg  daily and Cardizem CD 360mg  daily  3.  Obesity  -I have encouraged him to get into a routine exercise program and cut back on carbs and portions.  -I discussed the [facility] Program with him and he is interested so I will refer him   COVID-19 Education: He has had both COVID 19 vaccines  Patient Risk:   After full review of this patient's clinical status, I feel that they are at least moderate risk at this time.  Time:   Today, I have spent 20 minutes on telemedicine discussing medical problems including OSA< HTN, Obesity and reviewing patient's chart including PAP compliance download.  Medication Adjustments/Labs and Tests Ordered: Current medicines are reviewed at length with the patient today.  Concerns regarding medicines are outlined above.  Tests Ordered: No orders of the defined types were placed in this encounter.  Medication Changes: No orders of the defined types were placed in this encounter.   Disposition:  Follow up in 1 year(s)  Signed, Armanda Magic, MD  03/06/2020 10:13 AM    [facility]

## 2020-03-09 ENCOUNTER — Other Ambulatory Visit: Payer: Self-pay | Admitting: Internal Medicine

## 2020-04-08 ENCOUNTER — Other Ambulatory Visit: Payer: Self-pay | Admitting: Internal Medicine

## 2020-04-08 ENCOUNTER — Ambulatory Visit
Admission: RE | Admit: 2020-04-08 | Discharge: 2020-04-08 | Disposition: A | Discharge status: Home / Self Care | Payer: Managed Care, Other (non HMO) | Source: Ambulatory Visit | Attending: Internal Medicine | Admitting: Internal Medicine

## 2020-04-08 DIAGNOSIS — M25519 Pain in unspecified shoulder: Secondary | ICD-10-CM

## 2020-04-09 ENCOUNTER — Other Ambulatory Visit: Payer: Self-pay

## 2020-04-09 ENCOUNTER — Encounter: Payer: Self-pay | Admitting: Dietician

## 2020-04-09 ENCOUNTER — Encounter: Payer: Managed Care, Other (non HMO) | Attending: Cardiology | Admitting: Dietician

## 2020-04-09 NOTE — Progress Notes (Addendum)
Medical Nutrition Therapy:  Appt start time: 1500 end time:  1630.   Assessment:  Primary concerns today:  Patient is here today alone.  He would like to lose weight.  History includes OSA (on bi-pap), HTN, Obesity, arrhythmias, left ventricular hypertrophy,  prediabetes with A1C of 5.7& 08/2018.  Weight hx: Lowest adult weight 180 lbs at 59 yo Highest weight 320 lbs 2 years ago (2019) Felt best 200-210 lbs (His goal)  He was on Belviqeu about 2 years ago for about 1 year.  This worked very well and he lost 45 lbs but FDA removed this from the market and he regained the weight.  He was prescribed Naltrexone which has not worked.  Patient feels that he can do nothing for weight loss without a Belviqu like drug.   Body Composition Scale Date 04/09/20  Current Body Weight 311 lbs  Total Body Fat % 37.8  Visceral Fat 34  Fat-Free Mass % 62.1   Total Body Water % 43.1  Muscle-Mass lbs 58.6  BMI 43.2  Body Fat Displacement          Torso  lbs 72.9         Left Leg  lbs 14.5         Right Leg  lbs 14.5         Left Arm  lbs 7.2         Right Arm   lbs 7.2   Patient lives with his wife, 28 yo son with autism that has come out as trans.  His youngest son is in school and is a Psychologist, clinical.  His father lives in [place] and [place] has to add to his father's retirement facility monthly.  He has a 7 yo ailing dog.  He states that everything has led to more emotional eating.  They eat out a lot.  They have different eating hours.  He cooks occasionally and is considered the main cook.  He enjoys sauteing vegetables and chicken.  Drinks wine rarely and used to over drink excessively in grad school.  He is concerned about addictive eating. 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 He lifts weights 5-6 days per week usually but injured himself 3 months ago and is giving him a chance for his  shoulder to heal. He states that he is eating like is is lifting but is no longer lifting. He likes increased amounts of meat. He states that he cannot write down everything that he eats. He states that he needs more help than cognitive behavioral therapy. He has been looking more at his hydration and pushing this but this has not helped his hunger. He exhibits binging behavior.    Preferred Learning Style:   No preference indicated   Learning Readiness:   Ready  Change in progress   MEDICATIONS: see list   DIETARY INTAKE:  Usual eating pattern includes 3 meals and 2-3 snacks per day.  Everyday foods include protein shake.  Avoided foods include coffee, tea.    24-hr recall:  B ( AM): Muscle milk protein powder with skim milk, 2 bananas, 4 oz oatmeal, cinnamon  Snk ( AM): pastries or other calorie dense snack  L ( PM): SubSpot shrimp salad or chicken salad sandwich on [employer] or Rye bread Snk ( PM): pastries or other calorie dense snack  D ( PM): SubSpot wrap or 2 cheeseburgers  or occasionally Elizabeth's OR Chinese (steamed food)  Snk ( PM): pastries or other calorie dense snack  Beverages: water, ICE beverages, occasional diet coke or diet pepsi  Usual physical activity: currently not exercising due to waiting for his shoulder to heal as well as problems on foot related to a spider bite   Progress Towards Goal(s):  In progress.   Nutritional Diagnosis:  [place]-3.3 Overweight/obesity As related to excessive calories.  As evidenced by diet hx- BMI 43.    Intervention:  Nutrition education related to habit changes and nutrition to improve nutrition profile. Discussed medications for weight loss per patient request. Forwarded a request for Contrave (Naltrexone and Bupropion) to his MD per his request and also discussed that some have benefits from a GLP-1 inhibitor. Discussed nutrients and lack of current balance in nutrition due to current eating choices as well as  supplementation. Discussed mindfulness.   Discussed importance of balanced meals to help with craving later. Discussed alternative snack options that are lower in caloric density and higher in nutrition density.  Discussed benefits to exercise and options. Considered the Noom program for daily mindfulness education and support.  Plan:   "Simple and Consistent" habit change.  Consider resuming fish oil and starting on a MVI Consider asking your physician about a GLP-1 medication such as Ozempic that can be used for some to reduce weight. OR Contrave- I will ask your MD Mindfulness  Eat slowly, enjoy your food  Stop eating when you are satisfied.  What kind of hunger do you feel?  Is it emotional or physical hunger?   Consider alternative snack options:   1/4 cup nuts, fresh fruit  Raw vegetables, hummus  See snack sheet for ideas  Find ways to increase your vegetable intake.   Consider exercise options- exercise bike etc. Once your spider bite is healed.  Consider Noom (mindfulness with eating)  This is a daily activity on your cell phone.  Teaching Method Utilized:  Visual Auditory  Handouts given during visit include:  Snack sheet   Barriers to learning/adherence to lifestyle change: schedule, stress, family dynamics  Demonstrated degree of understanding via:  Teach Back   Monitoring/Evaluation:  Dietary intake, exercise, and body weight in 1 month(s).

## 2020-04-09 NOTE — Patient Instructions (Addendum)
"  Simple and Consistent" habit change.  Consider resuming fish oil and starting on a MVI Consider asking your physician about a GLP-1 medication such as Ozempic that can be used for some to reduce weight. OR Contrave- I will ask your MD Mindfulness  Eat slowly, enjoy your food  Stop eating when you are satisfied.  What kind of hunger do you feel?  Is it emotional or physical hunger?   Consider alternative snack options:   1/4 cup nuts, fresh fruit  Raw vegetables, hummus  See snack sheet for ideas  Find ways to increase your vegetable intake.   Consider exercise options- exercise bike etc. Once your spider bite is healed.  Consider Noom (mindfulness with eating)  This is a daily activity on your cell phone.

## 2020-05-06 ENCOUNTER — Other Ambulatory Visit (HOSPITAL_COMMUNITY): Payer: Self-pay | Admitting: Internal Medicine

## 2020-05-06 NOTE — Telephone Encounter (Signed)
Xarelto 20mg  refill request received. Pt is 59 years old, weight-141.1kg, Crea-1.22 on 07/25/2019 via [facility] at [facility], last seen by Dr. Natrona heights on 03/06/2020 via Telemedicine, Diagnosis-Afib, CrCl-128.19ml/min; Dose is appropriate based on dosing criteria. Will send in refill to requested pharmacy.

## 2020-05-07 ENCOUNTER — Ambulatory Visit: Payer: Managed Care, Other (non HMO) | Admitting: Dietician

## 2020-05-14 ENCOUNTER — Other Ambulatory Visit: Payer: Self-pay

## 2020-05-14 ENCOUNTER — Encounter: Payer: Self-pay | Admitting: Dietician

## 2020-05-14 ENCOUNTER — Encounter: Payer: Managed Care, Other (non HMO) | Attending: Cardiology | Admitting: Dietician

## 2020-05-14 NOTE — Progress Notes (Signed)
Medical Nutrition Therapy:  Appt start time: 1500 end time:  1630.   Assessment:  Primary concerns today:  Patient is here today alone.  He would like to lose weight and avoid diabetes. He was last seen by myself on 04/09/2020. He has returned to teaching in person. He states that he is sitting a lot and expected that he would gain weight but his weight is stable. Weight is unchanged. He has resumed weight lifting 90-120 minutes 3 times weekly. He states that he is sleeping well but has increased stress (fiancial and otherwise).   He states that he wants medication to help with his cravings and states that he has no control to lose weight otherwise.  Patient has not heard from his MD regarding a medication option and patient called Dr. Seth Bake during his visit today requesting this. Current diet lacks variety which may lead to increased cravings as well as patient use of food as a treatment for stress.  History includes OSA (on bi-pap), HTN, Obesity, arrhythmias, left ventricular hypertrophy,  prediabetes with A1C of 5.7& 08/2018.  Weight hx: Lowest adult weight 180 lbs at 59 yo Highest weight 320 lbs 60 years ago (2019) Felt best 200-210 lbs (His goal)  He was on Belviqeu about 60 years ago for about 1 year.  This worked very well and he lost 45 lbs but FDA removed this from the market and he regained the weight.  He was prescribed Naltrexone which has not worked.  Patient feels that he can do nothing for weight loss without a Belviqu like drug.  Body Composition Scale Date 05/14/20  Current Body Weight 60 lbs  Total Body Fat % 37.7  Visceral Fat 34  Fat-Free Mass % 62.2   Total Body Water % 43.2  Muscle-Mass lbs 58.6  BMI 43.2  Body Fat Displacement          Torso  lbs 14.5         Left Leg  lbs 14.5         Right Leg  lbs          Left Arm  lbs          Right Arm   lbs     Body Composition Scale Date 04/09/20  Current Body Weight 60 lbs  Total Body Fat % 37.8  Visceral Fat 34   Fat-Free Mass % 62.1   Total Body Water % 43.1  Muscle-Mass lbs 58.6  BMI 43.2  Body Fat Displacement          Torso  lbs 72.9         Left Leg  lbs 14.5         Right Leg  lbs 14.5         Left Arm  lbs 7.2         Right Arm   lbs 7.2    Patient lives with his wife, 61 yo son with autism that has come out as trans.  His youngest son is in school and is a Psychologist, clinical.  His father lives in [place] and [place] has to add to his father's retirement facility monthly.  He has a 36 yo ailing dog.  He states that everything has led to more emotional eating.  They eat out a lot.  They have different eating hours.  He cooks occasionally and is considered the main cook.  He enjoys sauteing vegetables and chicken. They often eat out as his son with  autism is a picky eater.  Drinks wine rarely and used to over drink excessively in grad school.  He is concerned about addictive eating. 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 He lifts weights 5-6 days per week usually but injured himself 3 months ago and is giving him a chance for his shoulder to heal. He states that he is eating like is is lifting but is no longer lifting. He likes increased amounts of meat. He states that he cannot write down everything that he eats. He states that he needs more help than cognitive behavioral therapy. He has been looking more at his hydration and pushing this but this has not helped his hunger. He exhibits binging behavior.   Preferred Learning Style:   No preference indicated   Learning Readiness:   Ready  Change in progress   MEDICATIONS: see list   DIETARY INTAKE:  Usual eating pattern includes 3 meals and 2-3 snacks per day.  Everyday foods include protein shake.  Avoided foods include coffee, tea.    24-hr recall:  B ( AM): Muscle milk protein powder (4 scoops) with skim milk, 2 bananas, 4  oz oatmeal, cinnamon - he states that this is the only thing that can satisfy him Snk ( AM): none L ( PM): SubSpot shrimp salad or chicken salad sandwich on [employer] or Rye bread Snk ( PM): pastries or other calorie dense snack  D ( PM): SubSpot wrap or 2 cheeseburgers or occasionally Elizabeth's OR Chinese (steamed food)  Snk ( PM): pastries or other calorie dense snack  Beverages: water, ICE beverages, occasional diet coke or diet pepsi  Usual physical activity: currently not exercising due to waiting for his shoulder to heal as well as problems on foot related to a spider bite   Progress Towards Goal(s):  In progress.   Nutritional Diagnosis:  [place]-3.3 Overweight/obesity As related to excessive calories.  As evidenced by diet hx- BMI 43.    Intervention:  Nutrition education related to habit changes and nutrition to improve nutrition profile. Watched videos to learn how Contrave works.  Patient called Dr. Seth Bake about this during this visit. Discussed nutrients and lack of current balance in nutrition due to current eating choices as well as supplementation and that improved balanced could change cravings.  Patient currently does not think that he can change this. Reviewed mindfulness as well as a "Bright Line Eating" approach.  Encouraged patient in increased aerobic exercise.  Plan:   Consider asking your primary doctor (Dr. Seth Bake) for a prescription - Contrave (Naltrexone and Bupropion).  There also might be benefits from a GLP-1 inhibitor but this may have GI side effects.  Set a time to walk or use the stationary bike.  Add spinach to your sub if available. Eat slowly, focus on the taste, chew food well! Find more calm  Consider trying to leave sugar out of your diet for 4 days to see if this helps your cravings.  Charlaine Dalton - Bright Line Eating   Plan (1st visit discussion)  "Simple and Consistent" habit change.  Consider resuming fish oil and starting on a MVI Consider  asking your physician about a GLP-1 medication such as Ozempic that can be used for some to reduce weight. OR Contrave- I will ask your MD Mindfulness  Eat slowly, enjoy your food  Stop eating when you are satisfied.  What kind of  hunger do you feel?  Is it emotional or physical hunger?   Consider alternative snack options:   1/4 cup nuts, fresh fruit  Raw vegetables, hummus  See snack sheet for ideas  Find ways to increase your vegetable intake.   Consider exercise options- exercise bike etc. Once your spider bite is healed.  Consider Noom (mindfulness with eating)  This is a daily activity on your cell phone.  Teaching Method Utilized:  Visual Auditory  Handouts given during visit include:  Snack sheet   Barriers to learning/adherence to lifestyle change: schedule, stress, family dynamics  Demonstrated degree of understanding via:  Teach Back   Monitoring/Evaluation:  Dietary intake, exercise, and body weight in 1 month(s).

## 2020-05-14 NOTE — Patient Instructions (Addendum)
Consider asking your primary doctor (Dr. Seth Bake) for a prescription - Contrave (Naltrexone and Bupropion).  There also might be benefits from a GLP-1 inhibitor but this may have GI side effects.  Set a time to walk or use the stationary bike.  Add spinach to your sub if available. Eat slowly, focus on the taste, chew food well! Find more calm  Consider trying to leave sugar out of your diet for 4 days to see if this helps your cravings.  Charlaine Dalton - Bright Line Eating

## 2020-08-04 ENCOUNTER — Other Ambulatory Visit: Payer: Self-pay | Admitting: Internal Medicine

## 2020-10-17 ENCOUNTER — Other Ambulatory Visit (HOSPITAL_COMMUNITY): Payer: Self-pay | Admitting: Radiology

## 2020-10-17 DIAGNOSIS — R0600 Dyspnea, unspecified: Secondary | ICD-10-CM

## 2020-10-22 ENCOUNTER — Other Ambulatory Visit (HOSPITAL_COMMUNITY): Payer: Managed Care, Other (non HMO)

## 2020-10-22 ENCOUNTER — Encounter: Payer: Self-pay | Admitting: Neurology

## 2020-10-22 ENCOUNTER — Other Ambulatory Visit: Payer: Self-pay

## 2020-10-22 DIAGNOSIS — R202 Paresthesia of skin: Secondary | ICD-10-CM

## 2020-10-24 ENCOUNTER — Inpatient Hospital Stay (HOSPITAL_COMMUNITY): Admission: RE | Admit: 2020-10-24 | Payer: Managed Care, Other (non HMO) | Source: Ambulatory Visit

## 2020-12-18 ENCOUNTER — Other Ambulatory Visit: Payer: Self-pay

## 2020-12-18 ENCOUNTER — Ambulatory Visit (INDEPENDENT_AMBULATORY_CARE_PROVIDER_SITE_OTHER): Payer: Managed Care, Other (non HMO) | Admitting: Neurology

## 2020-12-18 DIAGNOSIS — R202 Paresthesia of skin: Secondary | ICD-10-CM

## 2020-12-18 NOTE — Procedures (Signed)
[facility]  [address] [place], [address]  [place], [place] [address] Tel: [phone] Fax:  [phone] Test Date:  12/18/2020  Patient: Michael Mcgee DOB: 1961-06-21 Physician: Nita Sickle, DO  Sex: Male Height: 5\' 11"  Ref Phys: , MD  ID#: Lorenda Ishihara   Technician:    Patient Complaints: This is a 60 year old man referred for evaluation of muscle twitches and leg pain.  NCV & EMG Findings: Electrodiagnostic testing of the right lower extremity and additional studies of the left shows: 1. Bilateral sural and superficial peroneal sensory responses are within normal limits. 2. Bilateral peroneal and tibial motor responses are within normal limits. 3. Bilateral tibial H reflex studies are within normal limits. 4. There is no evidence of active or chronic motor axonal changes affecting any of the tested muscles.  Motor unit configuration and recruitment pattern is within normal limits.  Fasciculation potentials were not seen in any of the tested muscles.  Impression: This is a normal study of the lower extremities.  In particular, there is no evidence of a sensorimotor polyneuropathy or lumbosacral radiculopathy.   ___________________________ 77, DO    Nerve Conduction Studies Anti Sensory Summary Table   Stim Site NR Peak (ms) Norm Peak (ms) P-T Amp (V) Norm P-T Amp  Left Sup Peroneal Anti Sensory (Ant Lat Mall)  33C  12 cm    3.8 <4.6 9.4 >3  Right Sup Peroneal Anti Sensory (Ant Lat Mall)  33C  12 cm    2.4 <4.6 13.0 >3  Left Sural Anti Sensory (Lat Mall)  33C  Calf    2.4 <4.6 6.0 >3  Right Sural Anti Sensory (Lat Mall)  33C  Calf    2.4 <4.6 6.1 >3   Motor Summary Table   Stim Site NR Onset (ms) Norm Onset (ms) O-P Amp (mV) Norm O-P Amp Site1 Site2 Delta-0 (ms) Dist (cm) Vel (m/s) Norm Vel (m/s)  Left Peroneal Motor (Ext Dig Brev)  33C  Ankle    3.6 <6.0 2.7 >2.5 B Fib Ankle 8.1 38.0 47 >40  B Fib    11.7  2.3  Poplt B Fib  2.0 10.0 50 >40  Poplt    13.7  2.2         Right Peroneal Motor (Ext Dig Brev)  33C  Ankle    3.1 <6.0 4.5 >2.5 B Fib Ankle 8.4 40.0 48 >40  B Fib    11.5  4.0  Poplt B Fib 1.7 10.0 59 >40  Poplt    13.2  3.7         Left Tibial Motor (Abd Hall Brev)  33C  Ankle    3.6 <6.0 5.0 >4 Knee Ankle 9.7 43.0 44 >40  Knee    13.3  4.3         Right Tibial Motor (Abd Hall Brev)  33C  Ankle    3.5 <6.0 5.4 >4 Knee Ankle 9.1 43.0 47 >40  Knee    12.6  3.1          H Reflex Studies   NR H-Lat (ms) Lat Norm (ms) L-R H-Lat (ms)  Left Tibial (Gastroc)  33C     32.52 <35 2.04  Right Tibial (Gastroc)  33C     34.56 <35 2.04   EMG   Side Muscle Ins Act Fibs Psw Fasc Number Recrt Dur Dur. Amp Amp. Poly Poly. Comment  Right AntTibialis Nml Nml Nml Nml Nml Nml Nml Nml Nml Nml Nml  Nml N/A  Right Gastroc Nml Nml Nml Nml Nml Nml Nml Nml Nml Nml Nml Nml N/A  Right RectFemoris Nml Nml Nml Nml Nml Nml Nml Nml Nml Nml Nml Nml N/A  Left AntTibialis Nml Nml Nml Nml Nml Nml Nml Nml Nml Nml Nml Nml N/A  Left Gastroc Nml Nml Nml Nml Nml Nml Nml Nml Nml Nml Nml Nml N/A  Left RectFemoris Nml Nml Nml Nml Nml Nml Nml Nml Nml Nml Nml Nml N/A      Waveforms:

## 2021-02-01 IMAGING — DX DG SHOULDER 2+V*R*
3 series · 3 of 3 positions shown · non-contrast
Comparison: None.

CLINICAL DATA: Right shoulder pain for 2 months following weight
lifting, initial encounter

EXAM:
RIGHT SHOULDER - 2+ VIEW

[dg shoulder right (1 of 3)]
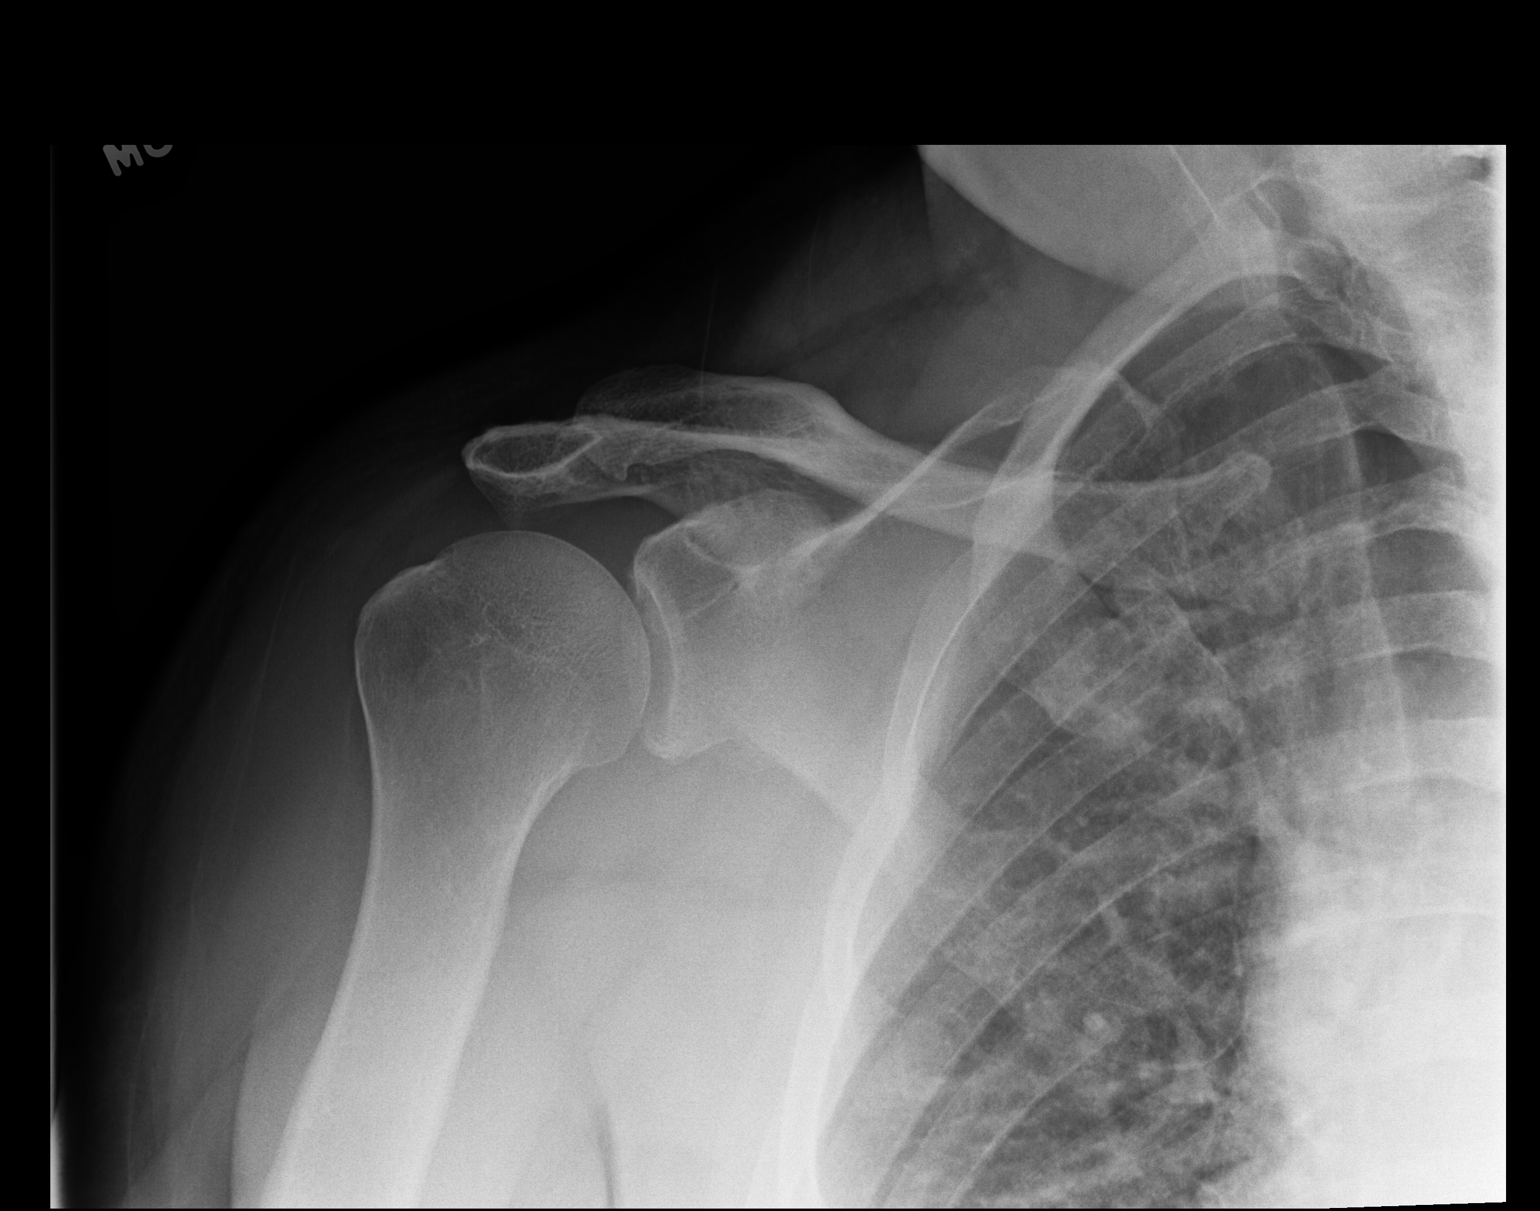

[dg shoulder right (2 of 3)]
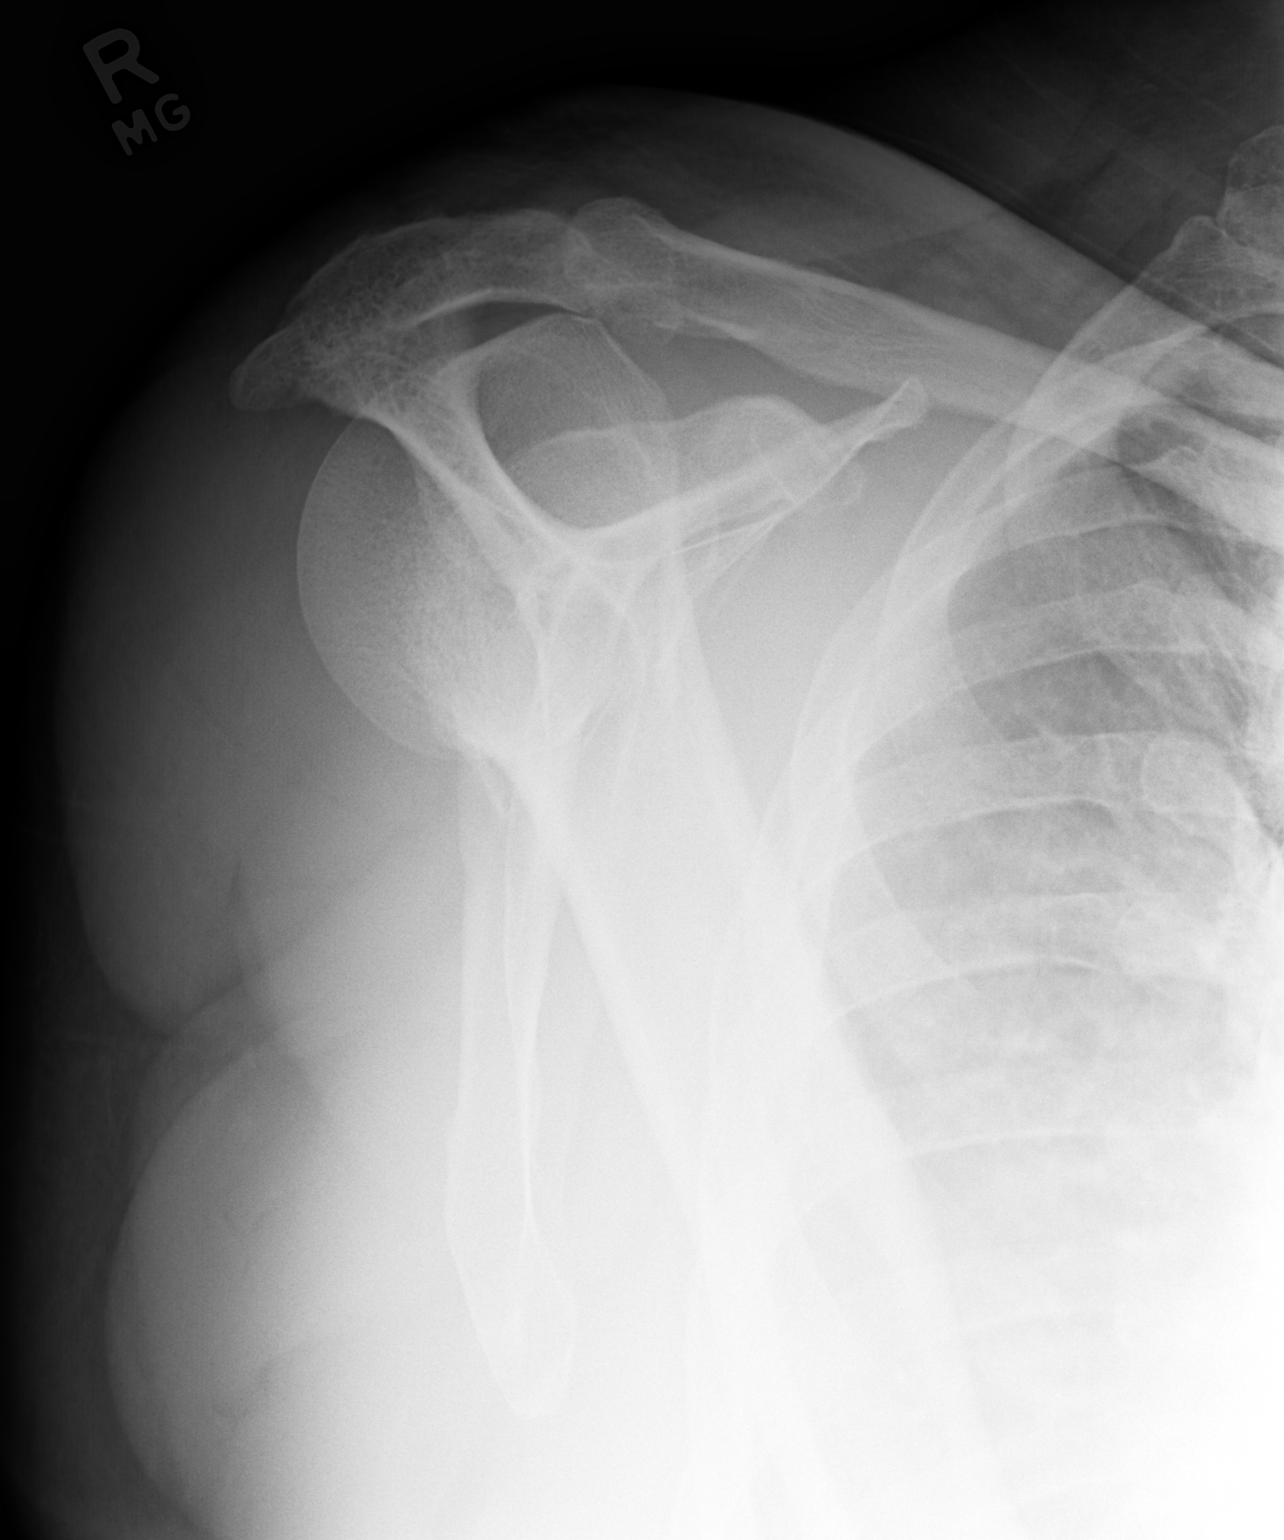

[dg shoulder right (3 of 3)]
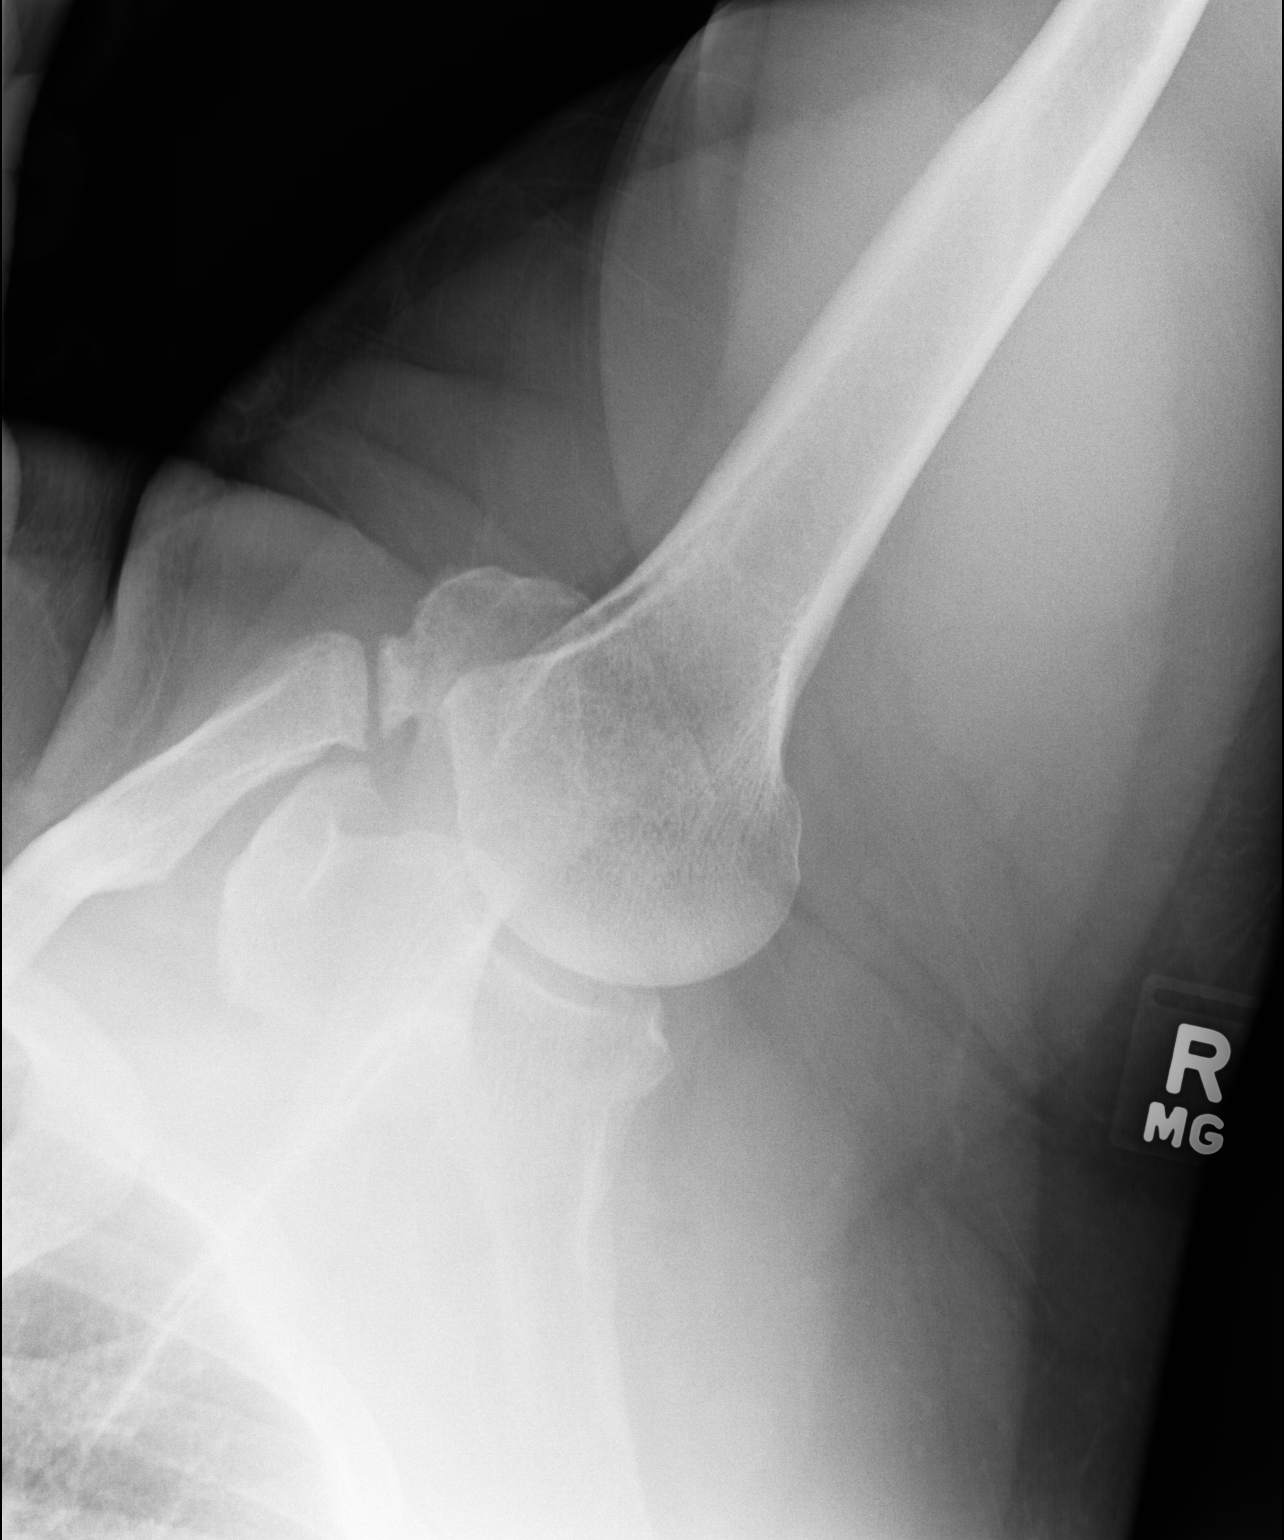

[3 of 3 positions shown; findings below may reference images not displayed]

FINDINGS: Mild degenerative changes of the acromioclavicular joint are seen.
No acute fracture or dislocation is noted. The underlying bony
thorax is within normal limits.
IMPRESSION: Mild degenerative change without acute abnormality.

## 2021-02-02 ENCOUNTER — Other Ambulatory Visit (HOSPITAL_COMMUNITY): Payer: Self-pay | Admitting: Internal Medicine

## 2021-02-04 NOTE — Telephone Encounter (Signed)
Prescription refill request for Xarelto received.  Indication: a fib Last office visit: 03/06/20  Weight:310 Age: 60  Scr: 1.27 CrCl: 121

## 2021-02-11 ENCOUNTER — Encounter: Payer: Self-pay | Admitting: Cardiology

## 2021-02-11 ENCOUNTER — Ambulatory Visit (INDEPENDENT_AMBULATORY_CARE_PROVIDER_SITE_OTHER): Payer: Managed Care, Other (non HMO) | Admitting: Cardiology

## 2021-02-11 ENCOUNTER — Ambulatory Visit (INDEPENDENT_AMBULATORY_CARE_PROVIDER_SITE_OTHER)
Admission: RE | Admit: 2021-02-11 | Discharge: 2021-02-11 | Disposition: A | Discharge status: Home / Self Care | Payer: Self-pay | Source: Ambulatory Visit | Attending: Cardiology | Admitting: Cardiology

## 2021-02-11 ENCOUNTER — Other Ambulatory Visit: Payer: Self-pay

## 2021-02-11 VITALS — BP 124/86 | HR 80 | Ht 71.0 in | Wt 301.0 lb

## 2021-02-11 DIAGNOSIS — I480 Paroxysmal atrial fibrillation: Secondary | ICD-10-CM

## 2021-02-11 DIAGNOSIS — G4733 Obstructive sleep apnea (adult) (pediatric): Secondary | ICD-10-CM | POA: Diagnosis not present

## 2021-02-11 DIAGNOSIS — I10 Essential (primary) hypertension: Secondary | ICD-10-CM

## 2021-02-11 LAB — CBC
Hematocrit: 49.5 % (ref 37.5–51.0)
Hemoglobin: 17.2 g/dL (ref 13.0–17.7)
MCH: 29.2 pg (ref 26.6–33.0)
MCHC: 34.7 g/dL (ref 31.5–35.7)
MCV: 84 fL (ref 79–97)
Platelets: 261 10*3/uL (ref 150–450)
RBC: 5.89 x10E6/uL — ABNORMAL HIGH (ref 4.14–5.80)
RDW: 13 % (ref 11.6–15.4)
WBC: 6.7 10*3/uL (ref 3.4–10.8)

## 2021-02-11 LAB — TSH: TSH: 1.24 u[IU]/mL (ref 0.450–4.500)

## 2021-02-11 LAB — BASIC METABOLIC PANEL WITH GFR
BUN/Creatinine Ratio: 18 (ref 10–24)
BUN: 23 mg/dL (ref 8–27)
CO2: 23 mmol/L (ref 20–29)
Calcium: 9.5 mg/dL (ref 8.6–10.2)
Chloride: 104 mmol/L (ref 96–106)
Creatinine, Ser: 1.26 mg/dL (ref 0.76–1.27)
Glucose: 99 mg/dL (ref 65–99)
Potassium: 4 mmol/L (ref 3.5–5.2)
Sodium: 143 mmol/L (ref 134–144)
eGFR: 65 mL/min/1.73

## 2021-02-11 MED ORDER — DILTIAZEM HCL ER COATED BEADS 360 MG PO CP24: ORAL_CAPSULE | ORAL | 3 refills | Status: AC

## 2021-02-11 MED ORDER — RIVAROXABAN 20 MG PO TABS: ORAL_TABLET | ORAL | 0 refills | Status: DC

## 2021-02-11 NOTE — Progress Notes (Signed)
80

## 2021-02-11 NOTE — Addendum Note (Signed)
Addended by: Gery Pray on: 02/11/2021 11:14 AM   Modules accepted: Orders

## 2021-02-11 NOTE — Patient Instructions (Signed)
Medication Instructions:  Your physician recommends that you continue on your current medications as directed. Please refer to the Current Medication list given to you today.  *If you need a refill on your cardiac medications before your next appointment, please call your pharmacy*   Lab Work: BMET today TSH today CBC today If you have labs (blood work) drawn today and your tests are completely normal, you will receive your results only by: Marland Kitchen MyChart Message (if you have MyChart) OR . A paper copy in the mail If you have any lab test that is abnormal or we need to change your treatment, we will call you to review the results.   Testing/Procedures: Your provider recommends you have a calcium scoring test today.   Follow-Up: At [facility], you and your health needs are our priority.  As part of our continuing mission to provide you with exceptional heart care, we have created designated Provider Care Teams.  These Care Teams include your primary Cardiologist (physician) and Advanced Practice Providers (APPs -  Physician Assistants and Nurse Practitioners) who all work together to provide you with the care you need, when you need it.  We recommend signing up for the patient portal called "MyChart".  Sign up information is provided on this After Visit Summary.  MyChart is used to connect with patients for Virtual Visits (Telemedicine).  Patients are able to view lab/test results, encounter notes, upcoming appointments, etc.  Non-urgent messages can be sent to your provider as well.   To learn more about what you can do with MyChart, go to [email or web address].    Your next appointment:   1 year(s)  The format for your next appointment:   In Person  Provider:   You may see Dr. Mayford Knife or one of the following Advanced Practice Providers on your designated Care Team:    Ronie Spies, PA-C  Jacolyn Reedy, PA-C    Other Instructions

## 2021-02-11 NOTE — Progress Notes (Addendum)
Date:  02/11/2021   ID:  Michael Mcgee, DOB November 14, 1960, MRN [id number]   PCP:  Lorenda Ishihara, MD  Cardiologist:  Dietrich Pates, MD  Sleep Medicine:  Armanda Magic, MD Electrophysiologist:  None   Chief Complaint:  OSA, PAF, HTN  History of Present Illness:    Michael Mcgee is a 60 y.o. male with a hx of hypertension, hyperlipidemia and atrial fibrillation who was referred by Dr. Dietrich Pates for sleep study due to atrial fibrillation and hypertension.  He underwent home sleep study showing severe obstructive sleep apnea with an AHI of 61.9 with oxygen desaturations less than 89% for 1 hour 32 minutes and less than 80% for 14 minutes.  He underwent CPAP titration but failed to adequately titrate due to ongoing respiratory events and was referred for BiPAP titration.  He underwent BiPAP titration of 13/9 centimeters H2O.  He is here today for followup and is doing well.  Occasionally he will notice some DOE when he has to wear a mask and exerts himself.  He also feels like his increased abdominal girth causes some SOB.  He denies any chest pain or pressure, PND, orthopnea, LE edema, dizziness, palpitations or syncope. He is compliant with his meds and is tolerating meds with no SE.    He is doing well with his BiPAP device and thinks that he has gotten used to it.  He tolerates the mask and feels the pressure is adequate.  Since going on BiPAP he feels rested in the am and has no significant daytime sleepiness if he sleeps well the night before.  Occasionally he will nap during the day.  He has problems with dry mouth in the am.  He denies any significant nasal dryness or nasal congestion.  He does not think that he snores.    Prior CV studies:   The following studies were reviewed today:  PAP compliance download  Past Medical History:  Diagnosis Date  . H/O dislocation of shoulder   . Hypertension   . KNEE PAIN 09/09/2009   Qualifier: Diagnosis of  By: Christell Constant CMA, [place]    . Morbid  obesity (HCC) 05/16/2018  . OSA on CPAP 05/16/2018   Severe obstructive sleep apnea with an AHI of 61.9 with oxygen desaturations less than 89% for 1 hour 32 minutes and less than 80% for 14 minutes. Now on BiPAP at  13/9 cm H2O  . PAF (paroxysmal atrial fibrillation) (HCC)   . Shoulder pain   . SOB (shortness of breath)    No past surgical history on file.   Current Meds  Medication Sig  . amoxicillin (AMOXIL) 500 MG capsule Take 500 mg by mouth 3 (three) times daily.  . clonazePAM (KLONOPIN) 2 MG tablet Take 2 mg by mouth at bedtime.  Marland Kitchen diltiazem (CARDIZEM CD) 360 MG 24 hr capsule TAKE 1 CAPSULE BY MOUTH EVERY DAY  . gemfibrozil (LOPID) 600 MG tablet Take 600 mg by mouth 2 (two) times daily before a meal.   . guaiFENesin 200 MG tablet Take 200 mg by mouth at bedtime.  Marland Kitchen ibuprofen (ADVIL) 400 MG tablet Take 400 mg by mouth every 6 (six) hours as needed.  [name] Ethyl (VASCEPA) 1 g CAPS Take 2 capsules (2 g total) by mouth 2 (two) times daily.  Marland Kitchen losartan (COZAAR) 100 MG tablet Take 1 tablet (100 mg total) daily by mouth.  . Melatonin 10 MG CAPS Take 10 mg by mouth at bedtime.  . predniSONE (DELTASONE) 10  MG tablet Take 10 mg by mouth daily with breakfast.  . testosterone cypionate (DEPOTESTOSTERONE CYPIONATE) 200 MG/ML injection Inject into the muscle once a week. .75 cc  . triamterene-hydrochlorothiazide (MAXZIDE-25) 37.5-25 MG tablet TAKE 1 TABLET BY MOUTH EVERY DAY  . XARELTO 20 MG TABS tablet TAKE 1 TABLET BY MOUTH EVERY DAY WITH SUPPER     Allergies:   Patient has no known allergies.   Social History   Tobacco Use  . Smoking status: Never Smoker  . Smokeless tobacco: Never Used  Vaping Use  . Vaping Use: Never used  Substance Use Topics  . Alcohol use: No  . Drug use: No     Family Hx: The patient's family history includes Healthy in his son.  ROS:   Please see the history of present illness.     All other systems reviewed and are negative.   Labs/Other Tests  and Data Reviewed:    Recent Labs: No results found for requested labs within last 8760 hours.   Recent Lipid Panel Lab Results  Component Value Date/Time   CHOL 131 10/26/2018 10:18 AM   TRIG 120 10/26/2018 10:18 AM   HDL 31 (L) 10/26/2018 10:18 AM   CHOLHDL 4.2 10/26/2018 10:18 AM   LDLCALC 76 10/26/2018 10:18 AM    Wt Readings from Last 3 Encounters:  02/11/21 (!) 301 lb (136.5 kg)  05/14/20 (!) 310 lb (140.6 kg)  04/09/20 (!) 311 lb (141.1 kg)     Objective:    Vital Signs:  BP 124/86   Pulse 80   Ht 5\' 11"  (1.803 m)   Wt (!) 301 lb (136.5 kg)   BMI 41.98 kg/m    GEN: Well nourished, well developed in no acute distress HEENT: Normal NECK: No JVD; No carotid bruits LYMPHATICS: No lymphadenopathy CARDIAC:RRR, no murmurs, rubs, gallops RESPIRATORY:  Clear to auscultation without rales, wheezing or rhonchi  ABDOMEN: Soft, non-tender, non-distended MUSCULOSKELETAL:  No edema; No deformity  SKIN: Warm and dry NEUROLOGIC:  Alert and oriented x 3 PSYCHIATRIC:  Normal affect    ASSESSMENT & PLAN:    1.  OSA - The patient is tolerating PAP therapy well without any problems. The PAP download performed by his DME was personally reviewed and interpreted by me today and showed an AHI of 5.7/hr on 13/9 cm H2O with 100% compliance in using more than 4 hours nightly.  The patient has been using and benefiting from PAP use and will continue to benefit from therapy.  -he is not a back sleeper but sometimes he gets a mask leak when he rolls over and thinks that is why his AHI is mildly elevated -I encouraged him to adjust the humidity in his device to help with mouth dryness  2.  HTN -BP is well controlled on exam today -check BMET and TSH today -Continue prescription drug management with Maxide 37.5-25mg  daily, Losartan 100mg  daily and Cardizem CD 360mg  daily with PRN refills   3.  Morbid Obesity  -he has lost 9lbs since I saw him last but still is morbidly obese -we  discussed weight loss options today including weight loss surgery but he is not interested at this time -he is interested in some of the newer weight loss meds but I asked him to talk with his PCP further about this -he is worried about his heart health given his morbid obesity -I will get a coronary Ca score to assess future cardiac risk  4. PAF -he has not had any  palpitations and is maintaining NSR on exam today -he denies any bleeding problems on his DOAC -check CBC and BMET today -Continue prescription drug management with Cardizem CD 360mg  daily and Xarelto 20mg  daily with PRN refills  Medication Adjustments/Labs and Tests Ordered: Current medicines are reviewed at length with the patient today.  Concerns regarding medicines are outlined above.  Tests Ordered: Orders Placed This Encounter  Procedures  . EKG 12-Lead   Medication Changes: No orders of the defined types were placed in this encounter.   Disposition:  Follow up in 1 year(s)  Signed, , MD  02/11/2021 10:31 AM    [facility]

## 2021-02-11 NOTE — Addendum Note (Signed)
Addended by: Gery Pray on: 02/11/2021 10:54 AM   Modules accepted: Orders

## 2021-02-12 ENCOUNTER — Telehealth: Payer: Self-pay

## 2021-02-12 DIAGNOSIS — R931 Abnormal findings on diagnostic imaging of heart and coronary circulation: Secondary | ICD-10-CM

## 2021-02-12 NOTE — Telephone Encounter (Signed)
Quintella Reichert, MD  02/11/2021 2:20 PM EDT      Mild coronary artery calcium in the LAD. Please get a copy of last FLP and ALT. Please get an ETT   The patient has been notified of the result and verbalized understanding.  All questions (if any) were answered. Theresia Majors, RN 02/12/2021 2:46 PM  Patient is getting lab work done at his PCP this month and he will have them fax over those results. ETT has been ordered and patient is aware that he will be called to set up an appointment.

## 2021-02-13 NOTE — Telephone Encounter (Signed)
Shared Decision Making/Informed Consent The risks [chest pain, shortness of breath, cardiac arrhythmias, dizziness, blood pressure fluctuations, myocardial infarction, stroke/transient ischemic attack, and life-threatening complications (estimated to be 1 in 10,000)], benefits (risk stratification, diagnosing coronary artery disease, treatment guidance) and alternatives of an exercise tolerance test were discussed in detail with Mr. Macleod and he agrees to proceed.

## 2021-02-25 ENCOUNTER — Other Ambulatory Visit: Payer: Self-pay

## 2021-02-25 ENCOUNTER — Ambulatory Visit (INDEPENDENT_AMBULATORY_CARE_PROVIDER_SITE_OTHER): Payer: Managed Care, Other (non HMO)

## 2021-02-25 DIAGNOSIS — R931 Abnormal findings on diagnostic imaging of heart and coronary circulation: Secondary | ICD-10-CM | POA: Diagnosis not present

## 2021-02-25 LAB — EXERCISE TOLERANCE TEST
Estimated workload: 7 METS
Exercise duration (min): 5 min
Exercise duration (sec): 0 s
MPHR: 159 {beats}/min
Peak HR: 139 {beats}/min
Percent HR: 87 %
RPE: 17
Rest HR: 85 {beats}/min

## 2021-03-05 ENCOUNTER — Telehealth: Payer: Self-pay | Admitting: Internal Medicine

## 2021-03-05 DIAGNOSIS — R931 Abnormal findings on diagnostic imaging of heart and coronary circulation: Secondary | ICD-10-CM

## 2021-03-05 NOTE — Telephone Encounter (Signed)
Please set patient up for lipomed panel with ApoB and Lp(a)

## 2021-03-06 NOTE — Addendum Note (Signed)
Addended by: Theresia Majors on: 03/06/2021 08:13 AM   Modules accepted: Orders

## 2021-03-06 NOTE — Telephone Encounter (Signed)
Left message for patient to call back to schedule fasting lab work.  Orders have been placed.

## 2021-03-07 NOTE — Telephone Encounter (Signed)
Pt aware of recommendations and will come in next Wednesday for lab work ./cy

## 2021-03-11 ENCOUNTER — Other Ambulatory Visit: Payer: Managed Care, Other (non HMO)

## 2021-03-13 ENCOUNTER — Other Ambulatory Visit: Payer: Self-pay

## 2021-03-13 ENCOUNTER — Other Ambulatory Visit: Payer: BC Managed Care – PPO

## 2021-03-13 DIAGNOSIS — R931 Abnormal findings on diagnostic imaging of heart and coronary circulation: Secondary | ICD-10-CM

## 2021-03-14 LAB — LIPID PANEL
Chol/HDL Ratio: 6.3 ratio — ABNORMAL HIGH (ref 0.0–5.0)
Cholesterol, Total: 183 mg/dL (ref 100–199)
HDL: 29 mg/dL — ABNORMAL LOW (ref >39)
LDL Chol Calc (NIH): 101 mg/dL — ABNORMAL HIGH (ref 0–99)
Triglycerides: 308 mg/dL — ABNORMAL HIGH (ref 0–149)
VLDL Cholesterol Cal: 53 mg/dL — ABNORMAL HIGH (ref 5–40)

## 2021-03-14 LAB — APOLIPOPROTEIN B: Apolipoprotein B: 109 mg/dL — ABNORMAL HIGH (ref <90)

## 2021-03-14 LAB — LIPOPROTEIN A (LPA): Lipoprotein (a): 18 nmol/L (ref <75.0)

## 2021-04-10 ENCOUNTER — Other Ambulatory Visit: Payer: Self-pay | Admitting: *Deleted

## 2021-04-10 DIAGNOSIS — R931 Abnormal findings on diagnostic imaging of heart and coronary circulation: Secondary | ICD-10-CM

## 2021-04-10 MED ORDER — ROSUVASTATIN CALCIUM 10 MG PO TABS: 10.0000 mg | ORAL_TABLET | Freq: Every day | ORAL | 3 refills | Status: DC

## 2021-04-10 NOTE — Telephone Encounter (Signed)
Lipids:   With plaquing noted on coronary arteries (mild) LDL should be lower    I would keep on same meds   Start Crstor 10 mg    F/U lipomed panel in 8 wks with AST

## 2021-04-10 NOTE — Telephone Encounter (Signed)
Pt aware of lab results and agrees with  plan ./cy ?

## 2021-04-28 DIAGNOSIS — G4733 Obstructive sleep apnea (adult) (pediatric): Secondary | ICD-10-CM | POA: Diagnosis not present

## 2021-06-25 DIAGNOSIS — Z03818 Encounter for observation for suspected exposure to other biological agents ruled out: Secondary | ICD-10-CM | POA: Diagnosis not present

## 2021-06-25 DIAGNOSIS — J0110 Acute frontal sinusitis, unspecified: Secondary | ICD-10-CM | POA: Diagnosis not present

## 2021-06-25 DIAGNOSIS — J069 Acute upper respiratory infection, unspecified: Secondary | ICD-10-CM | POA: Diagnosis not present

## 2021-07-06 ENCOUNTER — Other Ambulatory Visit: Payer: Self-pay | Admitting: Internal Medicine

## 2021-07-24 ENCOUNTER — Other Ambulatory Visit: Payer: Self-pay | Admitting: Internal Medicine

## 2021-07-24 ENCOUNTER — Other Ambulatory Visit: Payer: Self-pay | Admitting: Cardiology

## 2021-07-24 NOTE — Telephone Encounter (Signed)
Xarelto 20mg  refill request received. Pt is 60 years old, weight-136.5kg, Crea-1.26 on 02/11/2021, last seen by Dr. 04/13/2021 on 02/11/2021, Diagnosis-Afib, CrCl-118.23ml/min; Dose is appropriate based on dosing criteria. Will send in refill to requested pharmacy.

## 2021-07-29 DIAGNOSIS — G4733 Obstructive sleep apnea (adult) (pediatric): Secondary | ICD-10-CM | POA: Diagnosis not present

## 2021-08-01 ENCOUNTER — Other Ambulatory Visit: Payer: Self-pay | Admitting: Internal Medicine

## 2021-08-12 DIAGNOSIS — E785 Hyperlipidemia, unspecified: Secondary | ICD-10-CM | POA: Diagnosis not present

## 2021-08-12 DIAGNOSIS — Z0000 Encounter for general adult medical examination without abnormal findings: Secondary | ICD-10-CM | POA: Diagnosis not present

## 2021-08-25 DIAGNOSIS — L918 Other hypertrophic disorders of the skin: Secondary | ICD-10-CM | POA: Diagnosis not present

## 2021-08-25 DIAGNOSIS — R739 Hyperglycemia, unspecified: Secondary | ICD-10-CM | POA: Diagnosis not present

## 2021-08-25 DIAGNOSIS — B078 Other viral warts: Secondary | ICD-10-CM | POA: Diagnosis not present

## 2021-08-25 DIAGNOSIS — B351 Tinea unguium: Secondary | ICD-10-CM | POA: Diagnosis not present

## 2021-08-28 DIAGNOSIS — G4733 Obstructive sleep apnea (adult) (pediatric): Secondary | ICD-10-CM | POA: Diagnosis not present

## 2021-09-05 ENCOUNTER — Encounter: Payer: Self-pay | Admitting: Cardiology

## 2021-09-05 ENCOUNTER — Encounter: Payer: Self-pay | Admitting: Internal Medicine

## 2021-09-28 DIAGNOSIS — G4733 Obstructive sleep apnea (adult) (pediatric): Secondary | ICD-10-CM | POA: Diagnosis not present

## 2021-10-21 DIAGNOSIS — J209 Acute bronchitis, unspecified: Secondary | ICD-10-CM | POA: Diagnosis not present

## 2021-10-21 DIAGNOSIS — J029 Acute pharyngitis, unspecified: Secondary | ICD-10-CM | POA: Diagnosis not present

## 2021-10-27 DIAGNOSIS — G4733 Obstructive sleep apnea (adult) (pediatric): Secondary | ICD-10-CM | POA: Diagnosis not present

## 2021-10-29 DIAGNOSIS — G4733 Obstructive sleep apnea (adult) (pediatric): Secondary | ICD-10-CM | POA: Diagnosis not present

## 2021-11-12 DIAGNOSIS — I10 Essential (primary) hypertension: Secondary | ICD-10-CM | POA: Diagnosis not present

## 2021-11-24 DIAGNOSIS — G4733 Obstructive sleep apnea (adult) (pediatric): Secondary | ICD-10-CM | POA: Diagnosis not present

## 2021-11-28 DIAGNOSIS — J0100 Acute maxillary sinusitis, unspecified: Secondary | ICD-10-CM | POA: Diagnosis not present

## 2021-11-28 DIAGNOSIS — H00011 Hordeolum externum right upper eyelid: Secondary | ICD-10-CM | POA: Diagnosis not present

## 2021-12-15 DIAGNOSIS — R7303 Prediabetes: Secondary | ICD-10-CM | POA: Diagnosis not present

## 2021-12-15 DIAGNOSIS — I480 Paroxysmal atrial fibrillation: Secondary | ICD-10-CM | POA: Diagnosis not present

## 2021-12-15 DIAGNOSIS — I10 Essential (primary) hypertension: Secondary | ICD-10-CM | POA: Diagnosis not present

## 2021-12-18 DIAGNOSIS — J329 Chronic sinusitis, unspecified: Secondary | ICD-10-CM | POA: Diagnosis not present

## 2021-12-24 DIAGNOSIS — J4521 Mild intermittent asthma with (acute) exacerbation: Secondary | ICD-10-CM | POA: Diagnosis not present

## 2021-12-25 DIAGNOSIS — G4733 Obstructive sleep apnea (adult) (pediatric): Secondary | ICD-10-CM | POA: Diagnosis not present

## 2022-01-19 DIAGNOSIS — I119 Hypertensive heart disease without heart failure: Secondary | ICD-10-CM | POA: Diagnosis not present

## 2022-01-19 DIAGNOSIS — I480 Paroxysmal atrial fibrillation: Secondary | ICD-10-CM | POA: Diagnosis not present

## 2022-01-26 DIAGNOSIS — G4733 Obstructive sleep apnea (adult) (pediatric): Secondary | ICD-10-CM | POA: Diagnosis not present

## 2022-02-26 DIAGNOSIS — G4733 Obstructive sleep apnea (adult) (pediatric): Secondary | ICD-10-CM | POA: Diagnosis not present

## 2022-02-27 DIAGNOSIS — N289 Disorder of kidney and ureter, unspecified: Secondary | ICD-10-CM | POA: Diagnosis not present

## 2022-03-17 DIAGNOSIS — H93293 Other abnormal auditory perceptions, bilateral: Secondary | ICD-10-CM | POA: Diagnosis not present

## 2022-03-17 DIAGNOSIS — H9193 Unspecified hearing loss, bilateral: Secondary | ICD-10-CM | POA: Diagnosis not present

## 2022-03-17 DIAGNOSIS — H6123 Impacted cerumen, bilateral: Secondary | ICD-10-CM | POA: Diagnosis not present

## 2022-03-28 DIAGNOSIS — G4733 Obstructive sleep apnea (adult) (pediatric): Secondary | ICD-10-CM | POA: Diagnosis not present

## 2022-04-27 DIAGNOSIS — R7303 Prediabetes: Secondary | ICD-10-CM | POA: Diagnosis not present

## 2022-04-27 DIAGNOSIS — G4733 Obstructive sleep apnea (adult) (pediatric): Secondary | ICD-10-CM | POA: Diagnosis not present

## 2022-04-27 DIAGNOSIS — I480 Paroxysmal atrial fibrillation: Secondary | ICD-10-CM | POA: Diagnosis not present

## 2022-04-27 DIAGNOSIS — Z79899 Other long term (current) drug therapy: Secondary | ICD-10-CM | POA: Diagnosis not present

## 2022-04-30 DIAGNOSIS — G4733 Obstructive sleep apnea (adult) (pediatric): Secondary | ICD-10-CM | POA: Diagnosis not present

## 2022-05-28 DIAGNOSIS — G4733 Obstructive sleep apnea (adult) (pediatric): Secondary | ICD-10-CM | POA: Diagnosis not present

## 2022-06-27 DIAGNOSIS — G4733 Obstructive sleep apnea (adult) (pediatric): Secondary | ICD-10-CM | POA: Diagnosis not present

## 2022-07-27 DIAGNOSIS — G4733 Obstructive sleep apnea (adult) (pediatric): Secondary | ICD-10-CM | POA: Diagnosis not present

## 2022-07-31 DIAGNOSIS — G4733 Obstructive sleep apnea (adult) (pediatric): Secondary | ICD-10-CM | POA: Diagnosis not present

## 2022-08-26 DIAGNOSIS — G4733 Obstructive sleep apnea (adult) (pediatric): Secondary | ICD-10-CM | POA: Diagnosis not present

## 2022-09-01 DIAGNOSIS — R319 Hematuria, unspecified: Secondary | ICD-10-CM | POA: Diagnosis not present

## 2022-09-02 DIAGNOSIS — R310 Gross hematuria: Secondary | ICD-10-CM | POA: Diagnosis not present

## 2022-09-11 DIAGNOSIS — R310 Gross hematuria: Secondary | ICD-10-CM | POA: Diagnosis not present

## 2022-09-11 DIAGNOSIS — Z23 Encounter for immunization: Secondary | ICD-10-CM | POA: Diagnosis not present

## 2022-09-11 DIAGNOSIS — F419 Anxiety disorder, unspecified: Secondary | ICD-10-CM | POA: Diagnosis not present

## 2022-09-11 DIAGNOSIS — I10 Essential (primary) hypertension: Secondary | ICD-10-CM | POA: Diagnosis not present

## 2022-09-11 DIAGNOSIS — Z0000 Encounter for general adult medical examination without abnormal findings: Secondary | ICD-10-CM | POA: Diagnosis not present

## 2022-09-11 DIAGNOSIS — R319 Hematuria, unspecified: Secondary | ICD-10-CM | POA: Diagnosis not present

## 2022-09-11 DIAGNOSIS — I119 Hypertensive heart disease without heart failure: Secondary | ICD-10-CM | POA: Diagnosis not present

## 2022-09-11 DIAGNOSIS — I480 Paroxysmal atrial fibrillation: Secondary | ICD-10-CM | POA: Diagnosis not present

## 2022-09-11 DIAGNOSIS — E785 Hyperlipidemia, unspecified: Secondary | ICD-10-CM | POA: Diagnosis not present

## 2022-09-21 DIAGNOSIS — R310 Gross hematuria: Secondary | ICD-10-CM | POA: Diagnosis not present

## 2022-09-21 DIAGNOSIS — R3915 Urgency of urination: Secondary | ICD-10-CM | POA: Diagnosis not present

## 2022-09-26 DIAGNOSIS — G4733 Obstructive sleep apnea (adult) (pediatric): Secondary | ICD-10-CM | POA: Diagnosis not present

## 2022-09-28 DIAGNOSIS — R310 Gross hematuria: Secondary | ICD-10-CM | POA: Diagnosis not present

## 2022-09-28 DIAGNOSIS — N1330 Unspecified hydronephrosis: Secondary | ICD-10-CM | POA: Diagnosis not present

## 2022-10-15 DIAGNOSIS — R310 Gross hematuria: Secondary | ICD-10-CM | POA: Diagnosis not present

## 2022-10-19 ENCOUNTER — Inpatient Hospital Stay: Payer: BC Managed Care – PPO | Attending: Hematology and Oncology

## 2022-10-19 ENCOUNTER — Inpatient Hospital Stay (HOSPITAL_BASED_OUTPATIENT_CLINIC_OR_DEPARTMENT_OTHER): Payer: BC Managed Care – PPO | Admitting: Hematology and Oncology

## 2022-10-19 VITALS — BP 132/87 | HR 77 | Temp 97.3°F | Resp 97 | Wt 304.0 lb

## 2022-10-19 DIAGNOSIS — Z7901 Long term (current) use of anticoagulants: Secondary | ICD-10-CM | POA: Insufficient documentation

## 2022-10-19 DIAGNOSIS — I480 Paroxysmal atrial fibrillation: Secondary | ICD-10-CM

## 2022-10-19 DIAGNOSIS — I10 Essential (primary) hypertension: Secondary | ICD-10-CM | POA: Diagnosis not present

## 2022-10-19 DIAGNOSIS — Z79890 Hormone replacement therapy (postmenopausal): Secondary | ICD-10-CM

## 2022-10-19 DIAGNOSIS — R319 Hematuria, unspecified: Secondary | ICD-10-CM | POA: Insufficient documentation

## 2022-10-19 DIAGNOSIS — R1907 Generalized intra-abdominal and pelvic swelling, mass and lump: Secondary | ICD-10-CM | POA: Insufficient documentation

## 2022-10-19 DIAGNOSIS — D751 Secondary polycythemia: Secondary | ICD-10-CM

## 2022-10-19 LAB — CBC WITH DIFFERENTIAL (CANCER CENTER ONLY)
Abs Immature Granulocytes: 0.02 10*3/uL (ref 0.00–0.07)
Basophils Absolute: 0.1 10*3/uL (ref 0.0–0.1)
Basophils Relative: 1 %
Eosinophils Absolute: 0.3 10*3/uL (ref 0.0–0.5)
Eosinophils Relative: 4 %
HCT: 51.6 % (ref 39.0–52.0)
Hemoglobin: 17.7 g/dL — ABNORMAL HIGH (ref 13.0–17.0)
Immature Granulocytes: 0 %
Lymphocytes Relative: 24 %
Lymphs Abs: 1.7 10*3/uL (ref 0.7–4.0)
MCH: 28.9 pg (ref 26.0–34.0)
MCHC: 34.3 g/dL (ref 30.0–36.0)
MCV: 84.3 fL (ref 80.0–100.0)
Monocytes Absolute: 0.8 10*3/uL (ref 0.1–1.0)
Monocytes Relative: 11 %
Neutro Abs: 4.4 10*3/uL (ref 1.7–7.7)
Neutrophils Relative %: 60 %
Platelet Count: 190 10*3/uL (ref 150–400)
RBC: 6.12 MIL/uL — ABNORMAL HIGH (ref 4.22–5.81)
RDW: 16.1 % — ABNORMAL HIGH (ref 11.5–15.5)
WBC Count: 7.2 10*3/uL (ref 4.0–10.5)
nRBC: 0 % (ref 0.0–0.2)

## 2022-10-19 NOTE — Progress Notes (Signed)
[facility] CONSULT NOTE  Patient Care Team: Leeroy Cha, MD as PCP - General (Internal Medicine) Fay Records, MD as PCP - Cardiology (Cardiology) Sueanne Margarita, MD as PCP - Sleep Medicine (Cardiology)  CHIEF COMPLAINTS/PURPOSE OF CONSULTATION:  Polycythemia, hematuria, abdominal cyst  HISTORY OF PRESENTING ILLNESS:  Michael Mcgee 62 y.o. male is here because of multiple recent issues.  He noticed pulsating hematuria in December 2023.  This did not improved antibiotics and was referred to urology.  Cystoscopy did not reveal any bladder cancers.  It was felt to be related to bleeding blood vessel.  It spontaneously resolved.  As part of this workup he underwent a CT scan that revealed a left-sided abdominal cyst apparently.  I do not have a copy of this report.  There is a plan to perform an aspiration of this cyst.  He has been on Xarelto for several years for stroke prevention related to A-fib.  He tells me that he has not been in chronic A-fib and that he is more in sinus rhythm. He has been referred to [place] for evaluation for some of these recent issues.  I reviewed her records extensively and collaborated the history with the patient.   MEDICAL HISTORY:  Past Medical History:  Diagnosis Date   H/O dislocation of shoulder    Hypertension    KNEE PAIN 09/09/2009   Qualifier: Diagnosis of  By: Laurance Flatten CMA, [place]     Morbid obesity ([place]) 05/16/2018   OSA on CPAP 05/16/2018   Severe obstructive sleep apnea with an AHI of 61.9 with oxygen desaturations less than 89% for 1 hour 32 minutes and less than 80% for 14 minutes. Now on BiPAP at  13/9 cm H2O   PAF (paroxysmal atrial fibrillation) (HCC)    Shoulder pain    SOB (shortness of breath)     SURGICAL HISTORY: No past surgical history on file.  SOCIAL HISTORY: Social History   Socioeconomic History   Marital status: Married    Spouse name: Not on file   Number of children: Not on file   Years of  education: Not on file   Highest education level: Not on file  Occupational History   Not on file  Tobacco Use   Smoking status: Never   Smokeless tobacco: Never  Vaping Use   Vaping Use: Never used  Substance and Sexual Activity   Alcohol use: No   Drug use: No   Sexual activity: Not on file    Comment: married  Other Topics Concern   Not on file  Social History Narrative   Not on file   Social Determinants of Health   Financial Resource Strain: Not on file  Food Insecurity: Not on file  Transportation Needs: Not on file  Physical Activity: Not on file  Stress: Not on file  Social Connections: Not on file  Intimate Partner Violence: Not on file    FAMILY HISTORY: Family History  Problem Relation Age of Onset   Healthy Son     ALLERGIES:  has No Known Allergies.  MEDICATIONS:  Current Outpatient Medications  Medication Sig Dispense Refill   amoxicillin (AMOXIL) 500 MG capsule Take 500 mg by mouth 3 (three) times daily.     clonazePAM (KLONOPIN) 2 MG tablet Take 2 mg by mouth at bedtime.     diltiazem (CARDIZEM CD) 360 MG 24 hr capsule TAKE 1 CAPSULE BY MOUTH EVERY DAY 90 capsule 3   gemfibrozil (LOPID) 600 MG  tablet Take 600 mg by mouth 2 (two) times daily before a meal.      guaiFENesin 200 MG tablet Take 200 mg by mouth at bedtime.     ibuprofen (ADVIL) 400 MG tablet Take 400 mg by mouth every 6 (six) hours as needed.     Icosapent Ethyl (VASCEPA) 1 g CAPS Take 2 capsules (2 g total) by mouth 2 (two) times daily. 120 capsule 6   losartan (COZAAR) 100 MG tablet Take 1 tablet (100 mg total) daily by mouth. 90 tablet 3   Melatonin 10 MG CAPS Take 10 mg by mouth at bedtime.     predniSONE (DELTASONE) 10 MG tablet Take 10 mg by mouth daily with breakfast.     rivaroxaban (XARELTO) 20 MG TABS tablet TAKE 1 TABLET BY MOUTH EVERY DAY WITH SUPPER 90 tablet 1   rosuvastatin (CRESTOR) 10 MG tablet Take 1 tablet (10 mg total) by mouth daily. Please make overdue appt with Dr.  Harrington Challenger before anymore refills. Thank you 2nd attempt 15 tablet 0   testosterone cypionate (DEPOTESTOSTERONE CYPIONATE) 200 MG/ML injection Inject into the muscle once a week. .75 cc     triamterene-hydrochlorothiazide (MAXZIDE-25) 37.5-25 MG tablet TAKE 1 TABLET BY MOUTH EVERY DAY 90 tablet 2   No current facility-administered medications for this visit.    REVIEW OF SYSTEMS:   Constitutional: Denies fevers, chills or abnormal night sweats All other systems were reviewed with the patient and are negative.  PHYSICAL EXAMINATION: ECOG PERFORMANCE STATUS: 1 - Symptomatic but completely ambulatory  Vitals:   10/19/22 1514  BP: 132/87  Pulse: 77  Resp: (!) 97  Temp: (!) 97.3 F (36.3 C)  SpO2: 97%   Filed Weights   10/19/22 1514  Weight: (!) 304 lb (137.9 kg)    GENERAL:alert, no distress and comfortable  LABORATORY DATA:  I have reviewed the data as listed Lab Results  Component Value Date   WBC 6.7 02/11/2021   HGB 17.2 02/11/2021   HCT 49.5 02/11/2021   MCV 84 02/11/2021   PLT 261 02/11/2021   Lab Results  Component Value Date   NA 143 02/11/2021   K 4.0 02/11/2021   CL 104 02/11/2021   CO2 23 02/11/2021    RADIOGRAPHIC STUDIES: I have personally reviewed the radiological reports and agreed with the findings in the report.  ASSESSMENT AND PLAN:  Polycythemia, secondary Lab review: 09/11/2022: WBC 5.4, hemoglobin 17.3, hematocrit 52.6, MCV 86.4, platelets 198, BUN 29, creatinine 1.25, I discussed with the patient extensively the differential diagnosis of polycythemia 1. Primary polycythemia due to clonal stem cell abnormality 2. Secondary polycythemia due to cause that include hypoxia, heart or lung problems, altitude, athletics, erythropoietin producing lesion/tumors etc  Recommendation: 1. JAK-2 (MPN panel)testing to evaluate polycythemia vera  Patient uses testosterone replacement therapy and this could be the reason for the elevated hemoglobin levels.  The  other possible etiology could be related to obstructive sleep apnea and chronic shortness of breath.  Obesity: Patient has lost significant amount of weight on Mounjuro   Indications for phlebotomy 1. Primary polycythemia with hematocrit over 55 2. Secondary polycythemia with severe symptoms which include strokelike symptoms, severe recurrent headaches, severe fatigue.  Patient has been undergoing phlebotomies at 1 blood every 3 to 4 months.  I have encouraged him to continue with that.  Hematuria and cyst in the abdomen: I suspect that the cyst could be hemorrhagic cyst.  I do not think there is enough evidence to suggest that  there is a malignant process.  I discussed with him that if he is not in the chronic A-fib he may need to come off Xarelto to avoid such bleeding complications.  He will discuss this with his primary care physician and his cardiologist.  Telephone call in 1 week to go over the results of the MPN panel.  All questions were answered. The patient knows to call the clinic with any problems, questions or concerns.    Harriette Ohara, MD 10/19/22

## 2022-10-19 NOTE — Assessment & Plan Note (Signed)
Lab review: 09/11/2022: WBC 5.4, hemoglobin 17.3, hematocrit 52.6, MCV 86.4, platelets 198, BUN 29, creatinine 1.25, I discussed with the patient extensively the differential diagnosis of polycythemia 1. Primary polycythemia due to clonal stem cell abnormality 2. Secondary polycythemia due to cause that include hypoxia, heart or lung problems, altitude, athletics, erythropoietin producing lesion/tumors etc  Recommendation: 1. JAK-2 (MPN panel)testing to evaluate polycythemia vera 2. Erythropoietin level  Patient uses testosterone replacement therapy and this could be the reason for the elevated hemoglobin levels.   Indications for phlebotomy 1. Primary polycythemia with hematocrit over 55 2. Secondary polycythemia with severe symptoms which include strokelike symptoms, severe recurrent headaches, severe fatigue.  Patient does not have any clear-cut symptoms that would require phlebotomy at this time. If the erythropoietin is elevated, he will need ultrasound of the liver and kidney for further evaluation.   I provided patient written information on polycythemia.

## 2022-10-20 ENCOUNTER — Other Ambulatory Visit: Payer: Self-pay | Admitting: Urology

## 2022-10-20 DIAGNOSIS — N281 Cyst of kidney, acquired: Secondary | ICD-10-CM

## 2022-10-26 ENCOUNTER — Ambulatory Visit
Admission: RE | Admit: 2022-10-26 | Discharge: 2022-10-26 | Disposition: A | Discharge status: Home / Self Care | Payer: BC Managed Care – PPO | Source: Ambulatory Visit | Attending: Urology | Admitting: Urology

## 2022-10-26 DIAGNOSIS — N281 Cyst of kidney, acquired: Secondary | ICD-10-CM | POA: Diagnosis not present

## 2022-10-26 DIAGNOSIS — N1330 Unspecified hydronephrosis: Secondary | ICD-10-CM | POA: Diagnosis not present

## 2022-10-26 LAB — JAK2 (INCLUDING V617F AND EXON 12), MPL,& CALR W/RFL MPN PANEL (NGS)

## 2022-10-26 NOTE — Progress Notes (Signed)
HEMATOLOGY-ONCOLOGY TELEPHONE VISIT PROGRESS NOTE  I connected with our patient on 10/27/22 at 12:00 PM EST by telephone and verified that I am speaking with the correct person using two identifiers.  I discussed the limitations, risks, security and privacy concerns of performing an evaluation and management service by telephone and the availability of in person appointments.  I also discussed with the patient that there may be a patient responsible charge related to this service. The patient expressed understanding and agreed to proceed.   History of Present Illness: Michael Mcgee 62 y.o. male is here because of multiple recent issues.  He noticed pulsating hematuria in December 2023.  This did not improved antibiotics and was referred to urology.  Cystoscopy did not reveal any bladder cancers.  He presents to the clinic for a telephone follow-up to discuss the results of the JAK2 mutation testing.    REVIEW OF SYSTEMS:   Constitutional: Denies fevers, chills or abnormal weight loss All other systems were reviewed with the patient and are negative. Observations/Objective:     Assessment Plan:  Polycythemia, secondary ab review: 09/11/2022: WBC 5.4, hemoglobin 17.3, hematocrit 52.6, MCV 86.4, platelets 198, BUN 29, creatinine 1.25, 10/19/2022: WBC 7.2, hemoglobin 17.7, platelets 190   Test results: JAK-2 (MPN panel): Negative   Patient uses testosterone replacement therapy and this could be the reason for the elevated hemoglobin levels.  The other possible etiology could be related to obstructive sleep apnea and chronic shortness of breath.   Obesity: Patient has lost significant amount of weight on Mounjuro      Plan: Patient has been undergoing phlebotomies at 1 blood every 3 to 4 months.  I have encouraged him to continue with that. Abdominal cyst: Patient is going to undergo cyst aspiration. Return to clinic on an as-needed basis   I discussed the assessment and treatment plan with  the patient. The patient was provided an opportunity to ask questions and all were answered. The patient agreed with the plan and demonstrated an understanding of the instructions. The patient was advised to call back or seek an in-person evaluation if the symptoms worsen or if the condition fails to improve as anticipated.   I provided 12 minutes of non-face-to-face time during this encounter.  This includes time for charting and coordination of care   Michael Ohara, MD  I Michael Mcgee am acting as a scribe for Dr.Vinay Mcgee  I have reviewed the above documentation for accuracy and completeness, and I agree with the above.

## 2022-10-26 NOTE — Assessment & Plan Note (Signed)
ab review: 09/11/2022: WBC 5.4, hemoglobin 17.3, hematocrit 52.6, MCV 86.4, platelets 198, BUN 29, creatinine 1.25, I discussed with the patient extensively the differential diagnosis of polycythemia 1. Primary polycythemia due to clonal stem cell abnormality 2. Secondary polycythemia due to cause that include hypoxia, heart or lung problems, altitude, athletics, erythropoietin producing lesion/tumors etc   Recommendation: 1. JAK-2 (MPN panel)testing to evaluate polycythemia vera): Neg  Patient uses testosterone replacement therapy and this could be the reason for the elevated hemoglobin levels.  The other possible etiology could be related to obstructive sleep apnea and chronic shortness of breath.   Obesity: Patient has lost significant amount of weight on Mounjuro     Indications for phlebotomy 1. Primary polycythemia with hematocrit over 55 2. Secondary polycythemia with severe symptoms which include strokelike symptoms, severe recurrent headaches, severe fatigue.   Patient has been undergoing phlebotomies at 1 blood every 3 to 4 months.  I have encouraged him to continue with that.     Telephone call in 1 week to go over the results of the MPN panel.

## 2022-10-26 NOTE — Consult Note (Signed)
Chief Complaint:  Large left renal cyst  Referring Physician(s): Machen,Graham L  History of Present Illness: Michael Mcgee is a 62 y.o. male who was recently seen by urology for a hematuria evaluation.  He had a negitve cystoscopy followed by a hematuria protocol CT which demonstrated a very large left renal cyst with some mass effect on the left kidney collecting system resulting in chronic appearing left hydronephrosis.  He presents today to review renal cyst aspiration and possible sclerotherapy treatment.  Overall he has no significant abdominal pelvic complaints.  Hematuria has not recurred.  Normal creatinine at 1.25.  No significant abdominal pain or flank pain.  No symptomatic abdominal distention or bloating.  Past Medical History:  Diagnosis Date   H/O dislocation of shoulder    Hypertension    KNEE PAIN 09/09/2009   Qualifier: Diagnosis of  By: Laurance Flatten CMA, [place]     Morbid obesity ([place]) 05/16/2018   OSA on CPAP 05/16/2018   Severe obstructive sleep apnea with an AHI of 61.9 with oxygen desaturations less than 89% for 1 hour 32 minutes and less than 80% for 14 minutes. Now on BiPAP at  13/9 cm H2O   PAF (paroxysmal atrial fibrillation) (HCC)    Shoulder pain    SOB (shortness of breath)     No past surgical history on file.  Allergies: Patient has no known allergies.  Medications: Prior to Admission medications   Medication Sig Start Date End Date Taking? Authorizing Provider  amoxicillin (AMOXIL) 500 MG capsule Take 500 mg by mouth 3 (three) times daily.    [provider]  clonazePAM (KLONOPIN) 2 MG tablet Take 2 mg by mouth at bedtime.    [provider]  diltiazem (CARDIZEM CD) 360 MG 24 hr capsule TAKE 1 CAPSULE BY MOUTH EVERY DAY 02/11/21   Sueanne Margarita, MD  gemfibrozil (LOPID) 600 MG tablet Take 600 mg by mouth 2 (two) times daily before a meal.  09/20/19   [provider]  guaiFENesin 200 MG tablet Take 200 mg by mouth at  bedtime.    [provider]  ibuprofen (ADVIL) 400 MG tablet Take 400 mg by mouth every 6 (six) hours as needed.    [provider]  Icosapent Ethyl (VASCEPA) 1 g CAPS Take 2 capsules (2 g total) by mouth 2 (two) times daily. 11/12/17   Fay Records, MD  losartan (COZAAR) 100 MG tablet Take 1 tablet (100 mg total) daily by mouth. 07/22/17   Fay Records, MD  Melatonin 10 MG CAPS Take 10 mg by mouth at bedtime.    [provider]  predniSONE (DELTASONE) 10 MG tablet Take 10 mg by mouth daily with breakfast.    [provider]  rivaroxaban (XARELTO) 20 MG TABS tablet TAKE 1 TABLET BY MOUTH EVERY DAY WITH SUPPER 07/24/21   Sueanne Margarita, MD  rosuvastatin (CRESTOR) 10 MG tablet Take 1 tablet (10 mg total) by mouth daily. Please make overdue appt with Dr. Harrington Challenger before anymore refills. Thank you 2nd attempt 07/24/21   Fay Records, MD  testosterone cypionate (DEPOTESTOSTERONE CYPIONATE) 200 MG/ML injection Inject into the muscle once a week. .75 cc    [provider]  triamterene-hydrochlorothiazide (MAXZIDE-25) 37.5-25 MG tablet TAKE 1 TABLET BY MOUTH EVERY DAY 07/07/21   Fay Records, MD     Family History  Problem Relation Age of Onset   Healthy Son     Social  History   Socioeconomic History   Marital status: Married    Spouse name: Not on file   Number of children: Not on file   Years of education: Not on file   Highest education level: Not on file  Occupational History   Not on file  Tobacco Use   Smoking status: Never   Smokeless tobacco: Never  Vaping Use   Vaping Use: Never used  Substance and Sexual Activity   Alcohol use: No   Drug use: No   Sexual activity: Not on file    Comment: married  Other Topics Concern   Not on file  Social History Narrative   Not on file   Social Determinants of Health   Financial Resource Strain: Not on file  Food Insecurity: Not on file  Transportation Needs: Not on file  Physical Activity:  Not on file  Stress: Not on file  Social Connections: Not on file       Review of Systems: A 12 point ROS discussed and pertinent positives are indicated in the HPI above.  All other systems are negative.  Review of Systems  Vital Signs: BP 135/80 (BP Location: Left Arm, Patient Position: Sitting, Cuff Size: Large)   Pulse 86   Temp 97.8 F (36.6 C) (Oral)   Wt 133.4 kg   SpO2 95% Comment: room air  BMI 41.00 kg/m     Physical Exam Constitutional:      General: He is not in acute distress.    Appearance: He is not ill-appearing or toxic-appearing.  Eyes:     General: No scleral icterus.    Conjunctiva/sclera: Conjunctivae normal.  Cardiovascular:     Rate and Rhythm: Normal rate and regular rhythm.     Heart sounds: No murmur heard. Pulmonary:     Effort: Pulmonary effort is normal.     Breath sounds: Normal breath sounds.  Abdominal:     General: Bowel sounds are normal.     Tenderness: There is no abdominal tenderness. There is no guarding.  Skin:    Coloration: Skin is not jaundiced.  Neurological:     General: No focal deficit present.     Mental Status: Mental status is at baseline.  Psychiatric:        Mood and Affect: Mood normal.        Thought Content: Thought content normal.         Imaging: No results found.  Labs:  CBC: Recent Labs    10/19/22 1612  WBC 7.2  HGB 17.7*  HCT 51.6  PLT 190    COAGS: No results for input(s): "INR", "APTT" in the last 8760 hours.  BMP: No results for input(s): "NA", "K", "CL", "CO2", "GLUCOSE", "BUN", "CALCIUM", "CREATININE", "GFRNONAA", "GFRAA" in the last 8760 hours.  Invalid input(s): "CMP"  LIVER FUNCTION TESTS: No results for input(s): "BILITOT", "AST", "ALT", "ALKPHOS", "PROT", "ALBUMIN" in the last 8760 hours.  Assessment and Plan:  Large 19 cm left renal sinus cyst with some degree of mass effect with chronic appearing left hydronephrosis.  Treatment options were reviewed including  continued observation, cyst aspiration, follow-up imaging, as well as staged cyst drainage followed by fluoroscopic injection and possible sclerotherapy treatment with doxycycline or alcohol.  All questions were addressed.  He has a clear understanding of the options.  He would like to proceed with ultrasound cyst aspiration initially to determine if there are any chronic symptoms related to the cyst.  Cyst fluid can also be sent for  cytology.  He has follow-up imaging scheduled in 3 months to reevaluate a small left lower lobe nodule as well as a indeterminate left retroperitoneal lymph node.  The renal cyst can also be reassessed by that imaging.  Plan: Schedule for elective outpatient left renal cyst aspiration for cytology and symptom assessment.  He would like conscious sedation for the procedure.  Thank you for this interesting consult.  I greatly enjoyed meeting AXYL NIEMEIER and look forward to participating in their care.  A copy of this report was sent to the requesting provider on this date.  Electronically Signed: Greggory Keen 10/26/2022, 11:12 AM   I spent a total of  60 Minutes   in face to face in clinical consultation, greater than 50% of which was counseling/coordinating care for This patient with a 19 cm left renal sinus cyst

## 2022-10-27 ENCOUNTER — Telehealth: Payer: Self-pay

## 2022-10-27 ENCOUNTER — Inpatient Hospital Stay (HOSPITAL_BASED_OUTPATIENT_CLINIC_OR_DEPARTMENT_OTHER): Payer: BC Managed Care – PPO | Admitting: Hematology and Oncology

## 2022-10-27 DIAGNOSIS — D751 Secondary polycythemia: Secondary | ICD-10-CM | POA: Diagnosis not present

## 2022-10-27 DIAGNOSIS — R931 Abnormal findings on diagnostic imaging of heart and coronary circulation: Secondary | ICD-10-CM | POA: Insufficient documentation

## 2022-10-27 DIAGNOSIS — I4891 Unspecified atrial fibrillation: Secondary | ICD-10-CM | POA: Insufficient documentation

## 2022-10-27 NOTE — Telephone Encounter (Signed)
...     Pre-operative Risk Assessment    Patient Name: Michael Mcgee  DOB: December 20, 1960 MRN: [id number]      Request for Surgical Clearance    Procedure:   LEFT RENAL CYST ASPIRATION  Date of Surgery:  Clearance TBD                                 Surgeon:  [place] or Practice Name:  [facility] Phone number:  [phone] Fax number:  [phone]   Type of Clearance Requested:   - Medical  - Pharmacy:  Hold Rivaroxaban (Xarelto) [place] 2 DAYS BEFORE SURGERY   Type of Anesthesia:  Not Indicated   Additional requests/questions:    Gwenlyn Found   10/27/2022, 2:32 PM

## 2022-10-27 NOTE — Telephone Encounter (Signed)
Patient with diagnosis of afib on Xarelto for anticoagulation.    Procedure: LEFT RENAL CYST ASPIRATION  Date of procedure: TBD  CHA2DS2-VASc Score = 2  This indicates a 2.2% annual risk of stroke. The patient's score is based upon: CHF History: 0 HTN History: 1 Diabetes History: 0 Stroke History: 0 Vascular Disease History: 1 Age Score: 0 Gender Score: 0   CrCl 16m/min using adjusted body weight due to obesity Platelet count 190K  Per office protocol, patient can hold Xarelto for 2 days prior to procedure as requested as long as no relevant clinical changes are noted at upcoming office visit.    **This guidance is not considered finalized until pre-operative APP has relayed final recommendations.**

## 2022-10-27 NOTE — Telephone Encounter (Signed)
Pt has been scheduled to see Dr. Harrington Challenger, 11/09/22, clearance will be addressed that time.  Will route to the requesting surgeon's office to make them aware.

## 2022-10-27 NOTE — Telephone Encounter (Signed)
Primary Cardiologist:Paula Harrington Challenger, MD  Chart reviewed as part of pre-operative protocol coverage. Because of Michael Mcgee's past medical history and time since last visit, he/she will require a follow-up visit in order to better assess preoperative cardiovascular risk.  Pre-op covering staff: - Please schedule appointment and call patient to inform them. Has not been seen since 02/2021 - Please contact requesting surgeon's office via preferred method (i.e, phone, fax) to inform them of need for appointment prior to surgery.  This message will also be routed to pharmacy pool for input on holding anticoagulant agent as requested below so that this information is available at time of patient's appointment.   Emmaline Life, NP-C  10/27/2022, 3:01 PM [address] Office [phone] Fax [phone]

## 2022-10-30 DIAGNOSIS — G4733 Obstructive sleep apnea (adult) (pediatric): Secondary | ICD-10-CM | POA: Diagnosis not present

## 2022-10-31 DIAGNOSIS — G4733 Obstructive sleep apnea (adult) (pediatric): Secondary | ICD-10-CM | POA: Diagnosis not present

## 2022-11-02 DIAGNOSIS — R351 Nocturia: Secondary | ICD-10-CM | POA: Diagnosis not present

## 2022-11-02 DIAGNOSIS — R3915 Urgency of urination: Secondary | ICD-10-CM | POA: Diagnosis not present

## 2022-11-02 DIAGNOSIS — R350 Frequency of micturition: Secondary | ICD-10-CM | POA: Diagnosis not present

## 2022-11-08 NOTE — Progress Notes (Unsigned)
Cardiology Office Note   Date:  11/09/2022   ID:  Michael Mcgee, DOB [birth year], MRN [id number]  PCP:  Leeroy Cha, MD  Cardiologist:   Dorris Carnes, MD   Pt presents for follow up of  of HTN and atrial fib     History of Present Illness: Michael Mcgee is a 62 y.o. male with history of HTN, HL,  Paroxysmal Atrial Fibrillation and OSA (on CPAP).  Lexiscan myovue in 2018 showed normal perfusion  Echo in 2021 LVEF normal   Mild /mod LVH   Septum 13  PW 15      I saw the pt in 2021 IN 2022 he had A CT calcium score done   Score was 70 Exercises stress test (per patient's request ) : Peak BP 209/71  No ST changes to suggest ischemia  PVCs in reocvery  Overall low risk  Since seen he says he is very active   Denies CP  No SOB   No palpitations Being evaluated for a procedure by interventional radiology   Has an abdominal cyst   Needs clearance   Current Meds  Medication Sig   amoxicillin (AMOXIL) 500 MG capsule Take 500 mg by mouth 3 (three) times daily.   clonazePAM (KLONOPIN) 2 MG tablet Take 2 mg by mouth at bedtime.   diltiazem (CARDIZEM CD) 360 MG 24 hr capsule TAKE 1 CAPSULE BY MOUTH EVERY DAY   gemfibrozil (LOPID) 600 MG tablet Take 600 mg by mouth 2 (two) times daily before a meal.    guaiFENesin 200 MG tablet Take 200 mg by mouth at bedtime.   ibuprofen (ADVIL) 400 MG tablet Take 400 mg by mouth every 6 (six) hours as needed.   Icosapent Ethyl (VASCEPA) 1 g CAPS Take 2 capsules (2 g total) by mouth 2 (two) times daily.   losartan (COZAAR) 100 MG tablet Take 1 tablet (100 mg total) daily by mouth.   Melatonin 10 MG CAPS Take 10 mg by mouth at bedtime.   predniSONE (DELTASONE) 10 MG tablet Take 10 mg by mouth daily with breakfast.   rivaroxaban (XARELTO) 20 MG TABS tablet TAKE 1 TABLET BY MOUTH EVERY DAY WITH SUPPER   rosuvastatin (CRESTOR) 10 MG tablet Take 1 tablet (10 mg total) by mouth daily. Please make overdue appt with Dr. Harrington Challenger before anymore refills. Thank you  2nd attempt   testosterone cypionate (DEPOTESTOSTERONE CYPIONATE) 200 MG/ML injection Inject into the muscle once a week. .75 cc   tirzepatide (MOUNJARO) 12.5 MG/0.5ML Pen Inject 12.5 mg into the skin once a week.   triamterene-hydrochlorothiazide (MAXZIDE-25) 37.5-25 MG tablet TAKE 1 TABLET BY MOUTH EVERY DAY     Allergies:   Patient has no known allergies.   Past Medical History:  Diagnosis Date   H/O dislocation of shoulder    Hypertension    KNEE PAIN 09/09/2009   Qualifier: Diagnosis of  By: Laurance Flatten CMA, [place]     Morbid obesity ([place]) 05/16/2018   OSA on CPAP 05/16/2018   Severe obstructive sleep apnea with an AHI of 61.9 with oxygen desaturations less than 89% for 1 hour 32 minutes and less than 80% for 14 minutes. Now on BiPAP at  13/9 cm H2O   PAF (paroxysmal atrial fibrillation) (HCC)    Shoulder pain    SOB (shortness of breath)     No past surgical history on file.   Social History:  The patient  reports that he has never smoked. He has never used  smokeless tobacco. He reports that he does not drink alcohol and does not use drugs.   Family History:  The patient's family history includes Healthy in his son.    ROS:  Please see the history of present illness. All other systems are reviewed and  Negative to the above problem except as noted.    PHYSICAL EXAM: VS:  BP 124/80   Pulse 70   Ht '5\' 11"'$  (1.803 m)   Wt (!) 301 lb 6.4 oz (136.7 kg)   SpO2 96%   BMI 42.04 kg/m   GEN: Morbidly obese 62 yo , in no acute distress  HEENT: normal  Neck: JVP is normal.  No bruits  Cardiac: RRR; no murmur  No LE  edema  Respiratory:  clear to auscultation bilaterally GI: soft, nontender, No hepatomegaly    EKG:  EKG is ordered today.  SR 70 bpm    Lipid Panel    Component Value Date/Time   CHOL 183 03/13/2021 1604   TRIG 308 (H) 03/13/2021 1604   HDL 29 (L) 03/13/2021 1604   CHOLHDL 6.3 (H) 03/13/2021 1604   LDLCALC 101 (H) 03/13/2021 1604      Wt Readings from Last 3  Encounters:  11/09/22 (!) 301 lb 6.4 oz (136.7 kg)  10/26/22 294 lb (133.4 kg)  10/19/22 (!) 304 lb (137.9 kg)      ASSESSMENT AND PLAN:  1  HTN  Blood pressure is controlled on current regimen   Follow   2  CAD   Pt with Ca score of 70 in 2022   Continue risk factor modification  3  PAF   Remains in SR. CHADSVASC is 2   On Xarelto  OK to hold for procedure      4   Cardiac hypertrophy   Moderate LVH on echo in 2021  Keep control of BP    4 OSA  Continue CPAP  Will work to get readings  Pt says he is tolerating but concerned he may have a leak    6   Heme  Follows with Dr Julian Hy periodic phlebotomy    6  Obesity  Pt was taking Mounjaro.  Lost a signficant amount of wt   Came off after having urologic evaluation  Plans to go back on    Discussed diet   Plant based, low carb    7  Preop risk assessment   Pt being evaluated for drainage of a cyst by interventional radiology  From a cardiac standpoint I feel he is at low risk for an event and OK to proceed  Plan for follow up in [place] in 1 year   Current medicines are reviewed at length with the patient today.  The patient does not have concerns regarding medicines.  Signed, Dorris Carnes, MD  11/09/2022 9:47 PM    [facility] [place], [place], [place]  [address] Phone: [phone]; Fax: [phone]

## 2022-11-09 ENCOUNTER — Ambulatory Visit: Payer: BC Managed Care – PPO | Attending: Internal Medicine | Admitting: Internal Medicine

## 2022-11-09 ENCOUNTER — Telehealth: Payer: Self-pay | Admitting: Internal Medicine

## 2022-11-09 ENCOUNTER — Encounter: Payer: Self-pay | Admitting: Internal Medicine

## 2022-11-09 VITALS — BP 124/80 | HR 70 | Ht 71.0 in | Wt 301.4 lb

## 2022-11-09 DIAGNOSIS — I4891 Unspecified atrial fibrillation: Secondary | ICD-10-CM

## 2022-11-09 NOTE — Patient Instructions (Signed)
Medication Instructions:  No changes *If you need a refill on your cardiac medications before your next appointment, please call your pharmacy*   Lab Work: none If you have labs (blood work) drawn today and your tests are completely normal, you will receive your results only by: [place] (if you have MyChart) OR A paper copy in the mail If you have any lab test that is abnormal or we need to change your treatment, we will call you to review the results.   Testing/Procedures: none   Follow-Up: At [facility], you and your health needs are our priority.  As part of our continuing mission to provide you with exceptional heart care, we have created designated Provider Care Teams.  These Care Teams include your primary Cardiologist (physician) and Advanced Practice Providers (APPs -  Physician Assistants and Nurse Practitioners) who all work together to provide you with the care you need, when you need it.   Your next appointment:   12 month(s)  Provider:   Dorris Carnes, MD

## 2022-11-09 NOTE — Telephone Encounter (Signed)
Please send note to interventional Radiology (?Tomasita Crumble)  Needs it for clearance for procedure  PT is OK to go  Also, please get records from [facility] (clnic note and MRI)  Also Pt wants to know more about CPAP readings   Thinks may have a leak  Not sure who to call

## 2022-11-11 NOTE — Telephone Encounter (Signed)
Reached out to patient and call was completed.  Pt is agreeable to treatment.

## 2022-11-17 ENCOUNTER — Other Ambulatory Visit (HOSPITAL_COMMUNITY): Payer: Self-pay | Admitting: Interventional Radiology

## 2022-11-17 DIAGNOSIS — N281 Cyst of kidney, acquired: Secondary | ICD-10-CM

## 2022-11-18 NOTE — Telephone Encounter (Signed)
[facility] records requested via letter.

## 2022-11-23 ENCOUNTER — Other Ambulatory Visit: Payer: Self-pay | Admitting: Student

## 2022-11-23 DIAGNOSIS — N281 Cyst of kidney, acquired: Secondary | ICD-10-CM

## 2022-11-23 NOTE — H&P (Signed)
Chief Complaint: Patient was seen in consultation today for left renal cyst aspiration at the request of Machen,Graham L   Referring Physician(s): Vira Agar   Supervising Physician: Daryll Brod  Patient Status: [facility] - Out-pt  History of Present Illness: Michael Mcgee is a 62 y.o. male with PMHs of HTN, PAF on Xarelto, OSA, and large left renal cyst who presents for aspiration.   Patient was seen by urology for hematuria evaluation, work up showed a very large left renal cyst with some mass effect on the left kidney collecting system resulting in chronic appearing left hydronephrosis.  He was referred to Dr. Annamaria Boots by urology to discuss renal cyst aspiration and possible sclerotherapy treatment.   Patient has a consultation visit with Dr. Annamaria Boots on 10/26/22 where image finding and treatment plan were discuss in detail. After thorough discussion and shared decision making, patient decided to proceed with ultrasound guided cyst aspiration initially to determine if there are any chronic symptoms related to the cyst.   Patient presents to [facility] IR today for the aspiration.   Past Medical History:  Diagnosis Date   H/O dislocation of shoulder    Hypertension    KNEE PAIN 09/09/2009   Qualifier: Diagnosis of  By: Laurance Flatten CMA, [place]     Morbid obesity ([place]) 05/16/2018   OSA on CPAP 05/16/2018   Severe obstructive sleep apnea with an AHI of 61.9 with oxygen desaturations less than 89% for 1 hour 32 minutes and less than 80% for 14 minutes. Now on BiPAP at  13/9 cm H2O   PAF (paroxysmal atrial fibrillation) (HCC)    Shoulder pain    SOB (shortness of breath)     No past surgical history on file.  Allergies: Patient has no known allergies.  Medications: Prior to Admission medications   Medication Sig Start Date End Date Taking? Authorizing Provider  amoxicillin (AMOXIL) 500 MG capsule Take 500 mg by mouth 3 (three) times daily.    [provider]  clonazePAM (KLONOPIN) 2 MG  tablet Take 2 mg by mouth at bedtime.    [provider]  diltiazem (CARDIZEM CD) 360 MG 24 hr capsule TAKE 1 CAPSULE BY MOUTH EVERY DAY 02/11/21   Sueanne Margarita, MD  gemfibrozil (LOPID) 600 MG tablet Take 600 mg by mouth 2 (two) times daily before a meal.  09/20/19   [provider]  guaiFENesin 200 MG tablet Take 200 mg by mouth at bedtime.    [provider]  ibuprofen (ADVIL) 400 MG tablet Take 400 mg by mouth every 6 (six) hours as needed.    [provider]  Icosapent Ethyl (VASCEPA) 1 g CAPS Take 2 capsules (2 g total) by mouth 2 (two) times daily. 11/12/17   Fay Records, MD  losartan (COZAAR) 100 MG tablet Take 1 tablet (100 mg total) daily by mouth. 07/22/17   Fay Records, MD  Melatonin 10 MG CAPS Take 10 mg by mouth at bedtime.    [provider]  predniSONE (DELTASONE) 10 MG tablet Take 10 mg by mouth daily with breakfast.    [provider]  rivaroxaban (XARELTO) 20 MG TABS tablet TAKE 1 TABLET BY MOUTH EVERY DAY WITH SUPPER 07/24/21   Sueanne Margarita, MD  rosuvastatin (CRESTOR) 10 MG tablet Take 1 tablet (10 mg total) by mouth daily. Please make overdue appt with Dr. Harrington Challenger before anymore refills. Thank you 2nd attempt 07/24/21   Fay Records, MD  testosterone cypionate (DEPOTESTOSTERONE  CYPIONATE) 200 MG/ML injection Inject into the muscle once a week. .75 cc    [provider]  tirzepatide [facility]) 12.5 MG/0.5ML Pen Inject 12.5 mg into the skin once a week. 03/17/22   [provider]  triamterene-hydrochlorothiazide (MAXZIDE-25) 37.5-25 MG tablet TAKE 1 TABLET BY MOUTH EVERY DAY 07/07/21   Fay Records, MD     Family History  Problem Relation Age of Onset   Healthy Son     Social History   Socioeconomic History   Marital status: Married    Spouse name: Not on file   Number of children: Not on file   Years of education: Not on file   Highest education level: Not on file  Occupational History   Not on  file  Tobacco Use   Smoking status: Never   Smokeless tobacco: Never  Vaping Use   Vaping Use: Never used  Substance and Sexual Activity   Alcohol use: No   Drug use: No   Sexual activity: Not on file    Comment: married  Other Topics Concern   Not on file  Social History Narrative   Not on file   Social Determinants of Health   Financial Resource Strain: Not on file  Food Insecurity: Not on file  Transportation Needs: Not on file  Physical Activity: Not on file  Stress: Not on file  Social Connections: Not on file     Review of Systems: A 12 point ROS discussed and pertinent positives are indicated in the HPI above.  All other systems are negative.  Vital Signs: BP (!) 144/88   Pulse 70   Temp 98.4 F (36.9 C) (Oral)   Ht 5\' 11"  (1.803 m)   Wt 290 lb (131.5 kg)   SpO2 97%   BMI 40.45 kg/m   Physical Exam Vitals reviewed.  Constitutional:      General: He is not in acute distress. HENT:     Head: Normocephalic.     Mouth/Throat:     Mouth: Mucous membranes are moist.     Pharynx: Oropharynx is clear. No oropharyngeal exudate or posterior oropharyngeal erythema.  Cardiovascular:     Rate and Rhythm: Normal rate and regular rhythm.  Pulmonary:     Effort: Pulmonary effort is normal.     Breath sounds: Normal breath sounds.  Abdominal:     General: There is no distension.     Palpations: Abdomen is soft.     Tenderness: There is no abdominal tenderness.  Skin:    General: Skin is warm and dry.  Neurological:     Mental Status: He is alert and oriented to person, place, and time.  Psychiatric:        Mood and Affect: Mood normal.        Behavior: Behavior normal.        Thought Content: Thought content normal.        Judgment: Judgment normal.     MD Evaluation Airway: WNL Heart: WNL Abdomen: WNL Chest/ Lungs: WNL ASA  Classification: 3 Mallampati/Airway Score: Two  Imaging: No results found.  Labs:  CBC: Recent Labs    10/19/22 1612   WBC 7.2  HGB 17.7*  HCT 51.6  PLT 190    COAGS: No results for input(s): "INR", "APTT" in the last 8760 hours.  BMP: No results for input(s): "NA", "K", "CL", "CO2", "GLUCOSE", "BUN", "CALCIUM", "CREATININE", "GFRNONAA", "GFRAA" in the last 8760 hours.  Invalid input(s): "CMP"  LIVER FUNCTION TESTS: No  results for input(s): "BILITOT", "AST", "ALT", "ALKPHOS", "PROT", "ALBUMIN" in the last 8760 hours.  TUMOR MARKERS: No results for input(s): "AFPTM", "CEA", "CA199", "CHROMGRNA" in the last 8760 hours.  Assessment and Plan: 62 y.o. male with a large left renal cyst who presents for US guided aspiration.   NPO since MN VSS CBC/INR within normal limits On Xarelto, has been held for 48 hours NKDA  Risks and benefits discussed with the patient including bleeding, infection, damage to adjacent structures,and recurrent renal cyst which may require additional procedures in the future.   All of the patient's questions were answered, patient is agreeable to proceed. Consent signed and in chart.   Thank you for this interesting consult.  I greatly enjoyed meeting Michael Mcgee and look forward to participating in their care.  A copy of this report was sent to the requesting provider on this date.  Electronically Signed: Joaquim Nam, PA-C 11/23/2022, 9:10 AM   I spent a total of    25 Minutes in face to face in clinical consultation, greater than 50% of which was counseling/coordinating care for left renal cyst aspiration.   This chart was dictated using voice recognition software.  Despite best efforts to proofread,  errors can occur which can change the documentation meaning.

## 2022-11-24 ENCOUNTER — Other Ambulatory Visit: Payer: Self-pay

## 2022-11-24 ENCOUNTER — Ambulatory Visit (HOSPITAL_COMMUNITY)
Admission: RE | Admit: 2022-11-24 | Discharge: 2022-11-24 | Disposition: A | Discharge status: Home / Self Care | Payer: BC Managed Care – PPO | Source: Ambulatory Visit | Attending: Interventional Radiology | Admitting: Interventional Radiology

## 2022-11-24 DIAGNOSIS — N281 Cyst of kidney, acquired: Secondary | ICD-10-CM | POA: Insufficient documentation

## 2022-11-24 DIAGNOSIS — G4733 Obstructive sleep apnea (adult) (pediatric): Secondary | ICD-10-CM | POA: Insufficient documentation

## 2022-11-24 DIAGNOSIS — N1330 Unspecified hydronephrosis: Secondary | ICD-10-CM | POA: Diagnosis not present

## 2022-11-24 DIAGNOSIS — I480 Paroxysmal atrial fibrillation: Secondary | ICD-10-CM | POA: Insufficient documentation

## 2022-11-24 DIAGNOSIS — Z7901 Long term (current) use of anticoagulants: Secondary | ICD-10-CM | POA: Diagnosis not present

## 2022-11-24 DIAGNOSIS — I10 Essential (primary) hypertension: Secondary | ICD-10-CM | POA: Insufficient documentation

## 2022-11-24 HISTORY — PX: IR US GUIDE BX ASP/DRAIN: IMG2392

## 2022-11-24 LAB — CBC
HCT: 49.1 % (ref 39.0–52.0)
Hemoglobin: 16.2 g/dL (ref 13.0–17.0)
MCH: 28.4 pg (ref 26.0–34.0)
MCHC: 33 g/dL (ref 30.0–36.0)
MCV: 86 fL (ref 80.0–100.0)
Platelets: 209 10*3/uL (ref 150–400)
RBC: 5.71 MIL/uL (ref 4.22–5.81)
RDW: 14.9 % (ref 11.5–15.5)
WBC: 6.6 10*3/uL (ref 4.0–10.5)
nRBC: 0 % (ref 0.0–0.2)

## 2022-11-24 LAB — PROTIME-INR
INR: 1 (ref 0.8–1.2)
Prothrombin Time: 13.4 seconds (ref 11.4–15.2)

## 2022-11-24 MED ORDER — FENTANYL CITRATE (PF) 100 MCG/2ML IJ SOLN
INTRAMUSCULAR | Status: AC
  Filled 2022-11-24: qty 2

## 2022-11-24 MED ORDER — FENTANYL CITRATE (PF) 100 MCG/2ML IJ SOLN
INTRAMUSCULAR | Status: AC | PRN
  Administered 2022-11-24 (×2): 50 ug via INTRAVENOUS

## 2022-11-24 MED ORDER — MIDAZOLAM HCL 2 MG/2ML IJ SOLN
INTRAMUSCULAR | Status: AC | PRN
  Administered 2022-11-24 (×2): 1 mg via INTRAVENOUS

## 2022-11-24 MED ORDER — MIDAZOLAM HCL 2 MG/2ML IJ SOLN
INTRAMUSCULAR | Status: AC
  Filled 2022-11-24: qty 2

## 2022-11-24 MED ORDER — LIDOCAINE HCL 1 % IJ SOLN
INTRAMUSCULAR | Status: AC
  Administered 2022-11-24: 10 mL
  Filled 2022-11-24: qty 20

## 2022-11-24 MED ORDER — SODIUM CHLORIDE 0.9 % IV SOLN: INTRAVENOUS | Status: DC

## 2022-11-24 NOTE — Procedures (Signed)
Interventional Radiology Procedure Note  Procedure: [place] LEFT RENAL CYST ASPIRATION    Complications: None  Estimated Blood Loss:  M0  Findings: 1.7 L CYST FLUID REMOVED     Tamera Punt, MD

## 2022-11-26 LAB — CYTOLOGY - NON PAP

## 2022-11-28 DIAGNOSIS — G4733 Obstructive sleep apnea (adult) (pediatric): Secondary | ICD-10-CM | POA: Diagnosis not present

## 2022-11-30 ENCOUNTER — Other Ambulatory Visit: Payer: Self-pay | Admitting: Interventional Radiology

## 2022-11-30 DIAGNOSIS — N281 Cyst of kidney, acquired: Secondary | ICD-10-CM

## 2022-12-07 DIAGNOSIS — R350 Frequency of micturition: Secondary | ICD-10-CM | POA: Diagnosis not present

## 2022-12-07 DIAGNOSIS — E291 Testicular hypofunction: Secondary | ICD-10-CM | POA: Diagnosis not present

## 2022-12-08 DIAGNOSIS — E291 Testicular hypofunction: Secondary | ICD-10-CM | POA: Diagnosis not present

## 2022-12-08 DIAGNOSIS — R350 Frequency of micturition: Secondary | ICD-10-CM | POA: Diagnosis not present

## 2022-12-11 ENCOUNTER — Ambulatory Visit
Admission: RE | Admit: 2022-12-11 | Discharge: 2022-12-11 | Disposition: A | Discharge status: Home / Self Care | Payer: BC Managed Care – PPO | Source: Ambulatory Visit | Attending: Interventional Radiology | Admitting: Interventional Radiology

## 2022-12-11 DIAGNOSIS — N2889 Other specified disorders of kidney and ureter: Secondary | ICD-10-CM | POA: Diagnosis not present

## 2022-12-11 DIAGNOSIS — N281 Cyst of kidney, acquired: Secondary | ICD-10-CM

## 2022-12-11 NOTE — Progress Notes (Signed)
Patient ID: Michael Mcgee, male   DOB: 01/19/1961, 62 y.o.   MRN: [id number]       Chief Complaint:  Symptomatic large left renal cyst  Referring Physician(s): Machen  History of Present Illness: Michael Mcgee is a 62 y.o. male who is now 2-weeks status post ultrasound large left renal cyst aspiration yielding 1.7 L of cyst fluid.  Renal cyst was found during a urology workup for intermittent hematuria.  He did very well with the ultrasound renal cyst aspiration.  Cytology was negative.  He has recovered fully and is back to work.  No physical limitations.  Overall he is doing very well.  He reports improvement in his abdominal mild discomfort and distention.  He does feel like the cyst aspiration was beneficial.  No recurrent symptoms during the last couple of weeks.  Past Medical History:  Diagnosis Date   H/O dislocation of shoulder    Hypertension    KNEE PAIN 09/09/2009   Qualifier: Diagnosis of  By: Christell ConstantMoore CMA, [place]     Morbid obesity (HCC) 05/16/2018   OSA on CPAP 05/16/2018   Severe obstructive sleep apnea with an AHI of 61.9 with oxygen desaturations less than 89% for 1 hour 32 minutes and less than 80% for 14 minutes. Now on BiPAP at  13/9 cm H2O   PAF (paroxysmal atrial fibrillation) (HCC)    Shoulder pain    SOB (shortness of breath)     Past Surgical History:  Procedure Laterality Date   IR US GUIDE BX ASP/DRAIN  11/24/2022    Allergies: Patient has no known allergies.  Medications: Prior to Admission medications   Medication Sig Start Date End Date Taking? Authorizing Provider  clonazePAM (KLONOPIN) 2 MG tablet Take 2 mg by mouth at bedtime.    [provider]  diltiazem (CARDIZEM CD) 360 MG 24 hr capsule TAKE 1 CAPSULE BY MOUTH EVERY DAY 02/11/21   Quintella Reicherturner, Traci R, MD  gemfibrozil (LOPID) 600 MG tablet Take 600 mg by mouth 2 (two) times daily before a meal.  09/20/19   [provider]  guaiFENesin 200 MG tablet Take 200 mg by mouth at bedtime.     [provider]  ibuprofen (ADVIL) 400 MG tablet Take 400 mg by mouth every 6 (six) hours as needed.    [provider]  levofloxacin (LEVAQUIN) 750 MG tablet Take 750 mg by mouth daily.    [provider]  losartan (COZAAR) 100 MG tablet Take 1 tablet (100 mg total) daily by mouth. 07/22/17   Pricilla Riffleoss, Paula V, MD  Melatonin 10 MG CAPS Take 10 mg by mouth at bedtime.    [provider]  rivaroxaban (XARELTO) 20 MG TABS tablet TAKE 1 TABLET BY MOUTH EVERY DAY WITH SUPPER 07/24/21   Quintella Reicherturner, Traci R, MD  rosuvastatin (CRESTOR) 10 MG tablet Take 1 tablet (10 mg total) by mouth daily. Please make overdue appt with Dr. Tenny Crawoss before anymore refills. Thank you 2nd attempt 07/24/21   Pricilla Riffleoss, Paula V, MD  testosterone cypionate (DEPOTESTOSTERONE CYPIONATE) 200 MG/ML injection Inject into the muscle once a week. .75 cc    [provider]  tirzepatide [facility](MOUNJARO) 12.5 MG/0.5ML Pen Inject 12.5 mg into the skin once a week. 03/17/22   [provider]  triamterene-hydrochlorothiazide (MAXZIDE-25) 37.5-25 MG tablet TAKE 1 TABLET BY MOUTH EVERY DAY 07/07/21   Pricilla Riffleoss, Paula V, MD     Family History  Problem Relation Age of Onset   Healthy Son  Social History   Socioeconomic History   Marital status: Married    Spouse name: Not on file   Number of children: Not on file   Years of education: Not on file   Highest education level: Not on file  Occupational History   Not on file  Tobacco Use   Smoking status: Never   Smokeless tobacco: Never  Vaping Use   Vaping Use: Never used  Substance and Sexual Activity   Alcohol use: No   Drug use: No   Sexual activity: Not on file    Comment: married  Other Topics Concern   Not on file  Social History Narrative   Not on file   Social Determinants of Health   Financial Resource Strain: Not on file  Food Insecurity: Not on file  Transportation Needs: Not on file  Physical Activity: Not on file  Stress:  Not on file  Social Connections: Not on file       Review of Systems  Review of Systems: A 12 point ROS discussed and pertinent positives are indicated in the HPI above.  All other systems are negative.    Physical Exam No direct physical exam was performed   Telephone health visit only today to review his recent procedure and symptoms  Vital Signs: There were no vitals taken for this visit.  Imaging: IR US Guide Bx Asp/Drain  Result Date: 11/24/2022 INDICATION: Large left renal cyst measuring 19 cm by CT with associated left upper quadrant pain, fullness, and intermittent hydronephrosis EXAM: ULTRASOUND ASPIRATION LARGE LEFT RENAL CYST MEDICATIONS: 1% lidocaine local ANESTHESIA/SEDATION: Moderate (conscious) sedation was employed during this procedure. A total of Versed 2.0 mg and Fentanyl 100 mcg was administered intravenously by the radiology nurse. Total intra-service moderate Sedation Time: 16 minutes. The patient's level of consciousness and vital signs were monitored continuously by radiology nursing throughout the procedure under my direct supervision. COMPLICATIONS: None immediate. PROCEDURE: Informed written consent was obtained from the patient after a thorough discussion of the procedural risks, benefits and alternatives. All questions were addressed. Maximal Sterile Barrier Technique was utilized including caps, mask, sterile gowns, sterile gloves, sterile drape, hand hygiene and skin antiseptic. A timeout was performed prior to the initiation of the procedure. Previous imaging reviewed. Preliminary ultrasound performed of the anterior abdominal left upper quadrant. Large left renal cyst was localized and marked for aspiration. Under sterile conditions and local anesthesia, the 19 gauge 5 French 15 cm Yueh sheath needle was advanced into the cyst. Needle position confirmed with ultrasound. Images obtained for documentation. Suction aspiration yielded 1.7 L serosanguineous yellow  cyst fluid. Sample sent for cytology. Cyst was completely collapsed by aspiration. Images obtained for documentation. No immediate complication. Patient tolerated the procedure well. IMPRESSION: Successful ultrasound-guided left upper quadrant renal cyst aspiration. Electronically Signed   By: Judie Petit.  Kyley Solow M.D.   On: 11/24/2022 10:08    Labs:  CBC: Recent Labs    10/19/22 1612 11/24/22 0738  WBC 7.2 6.6  HGB 17.7* 16.2  HCT 51.6 49.1  PLT 190 209    COAGS: Recent Labs    11/24/22 0738  INR 1.0    BMP: No results for input(s): "NA", "K", "CL", "CO2", "GLUCOSE", "BUN", "CALCIUM", "CREATININE", "GFRNONAA", "GFRAA" in the last 8760 hours.  Invalid input(s): "CMP"  LIVER FUNCTION TESTS: No results for input(s): "BILITOT", "AST", "ALT", "ALKPHOS", "PROT", "ALBUMIN" in the last 8760 hours.  Assessment and Plan:  2 weeks status post large left renal cyst aspiration yielding 1.7  L of serous fluid.  Cytology was negative.  He reports improvement in his abdominal pelvic distention and discomfort.  Also improvement in his physical activity.  He does feel like he benefited from the procedure.  No recurrent symptoms.  Plan: Telephone follow-up and assessment in 3 months.      Electronically Signed: Berdine DanceMichael Cari Burgo 12/11/2022, 10:10 AM   I spent a total of    15 Minutes in remote  clinical consultation, greater than 50% of which was counseling/coordinating care for This patient status post large left renal cyst aspiration.    Visit type: Audio only (telephone). Audio (no video) only due to Brevity of this encounter. Alternative for in[facility]person [facility], [address], [place], [place]. This visit type was conducted due to national recommendations for restrictions regarding the COVID-19 Pandemic (e.g. social distancing).  This format is felt to be most appropriate for this patient at this time.  All issues noted in this document were discussed and addressed.

## 2022-12-25 DIAGNOSIS — E785 Hyperlipidemia, unspecified: Secondary | ICD-10-CM | POA: Diagnosis not present

## 2022-12-25 DIAGNOSIS — G2581 Restless legs syndrome: Secondary | ICD-10-CM | POA: Diagnosis not present

## 2022-12-25 DIAGNOSIS — F419 Anxiety disorder, unspecified: Secondary | ICD-10-CM | POA: Diagnosis not present

## 2022-12-25 DIAGNOSIS — Q6100 Congenital renal cyst, unspecified: Secondary | ICD-10-CM | POA: Diagnosis not present

## 2022-12-29 DIAGNOSIS — G4733 Obstructive sleep apnea (adult) (pediatric): Secondary | ICD-10-CM | POA: Diagnosis not present

## 2023-03-02 DIAGNOSIS — E291 Testicular hypofunction: Secondary | ICD-10-CM | POA: Diagnosis not present

## 2023-03-02 DIAGNOSIS — Z125 Encounter for screening for malignant neoplasm of prostate: Secondary | ICD-10-CM | POA: Diagnosis not present

## 2023-03-03 DIAGNOSIS — B001 Herpesviral vesicular dermatitis: Secondary | ICD-10-CM | POA: Diagnosis not present

## 2023-03-03 DIAGNOSIS — I10 Essential (primary) hypertension: Secondary | ICD-10-CM | POA: Diagnosis not present

## 2023-03-04 ENCOUNTER — Other Ambulatory Visit: Payer: Self-pay | Admitting: Interventional Radiology

## 2023-03-04 DIAGNOSIS — N281 Cyst of kidney, acquired: Secondary | ICD-10-CM

## 2023-03-10 ENCOUNTER — Telehealth: Payer: Self-pay | Admitting: Interventional Radiology

## 2023-03-10 NOTE — Telephone Encounter (Signed)
Per Dr Miles Costain ok to not have follow up. And can call if he needs [place] since he is doing well.

## 2023-03-22 DIAGNOSIS — H9193 Unspecified hearing loss, bilateral: Secondary | ICD-10-CM | POA: Diagnosis not present

## 2023-03-22 DIAGNOSIS — H938X3 Other specified disorders of ear, bilateral: Secondary | ICD-10-CM | POA: Diagnosis not present

## 2023-04-07 DIAGNOSIS — G4733 Obstructive sleep apnea (adult) (pediatric): Secondary | ICD-10-CM | POA: Diagnosis not present

## 2023-04-28 DIAGNOSIS — N1831 Chronic kidney disease, stage 3a: Secondary | ICD-10-CM | POA: Diagnosis not present

## 2023-04-28 DIAGNOSIS — E1165 Type 2 diabetes mellitus with hyperglycemia: Secondary | ICD-10-CM | POA: Diagnosis not present

## 2023-05-04 DIAGNOSIS — R739 Hyperglycemia, unspecified: Secondary | ICD-10-CM | POA: Diagnosis not present

## 2023-05-12 DIAGNOSIS — M7989 Other specified soft tissue disorders: Secondary | ICD-10-CM | POA: Diagnosis not present

## 2023-10-19 ENCOUNTER — Inpatient Hospital Stay
Payer: No Typology Code available for payment source | Attending: Hematology and Oncology | Admitting: Hematology and Oncology

## 2023-10-19 VITALS — BP 148/84 | HR 91 | Temp 98.4°F | Resp 20 | Ht 71.0 in | Wt 306.7 lb

## 2023-10-19 DIAGNOSIS — I4891 Unspecified atrial fibrillation: Secondary | ICD-10-CM | POA: Diagnosis not present

## 2023-10-19 DIAGNOSIS — G4730 Sleep apnea, unspecified: Secondary | ICD-10-CM | POA: Diagnosis not present

## 2023-10-19 DIAGNOSIS — D751 Secondary polycythemia: Secondary | ICD-10-CM | POA: Insufficient documentation

## 2023-10-19 DIAGNOSIS — R591 Generalized enlarged lymph nodes: Secondary | ICD-10-CM | POA: Insufficient documentation

## 2023-10-19 DIAGNOSIS — Z7901 Long term (current) use of anticoagulants: Secondary | ICD-10-CM | POA: Diagnosis not present

## 2023-10-19 NOTE — Assessment & Plan Note (Signed)
CT abdomen pelvis January 2025: At urology office: 2.4 cm para-aortic lymph node (it was 1.9 cm in 2024)  Differential diagnosis: Reactive lymph node versus autoimmune versus infection related versus malignancy  I discussed with the patient that it is unlikely to be a lymphoma given the fact that it is a solitary lymph node and even if it was a lymphoma it is extremely indolent in nature and hence these do not need to be treated.  For these reasons I do not recommend obtaining a biopsy of this.

## 2023-10-19 NOTE — Assessment & Plan Note (Signed)
ab review: 09/11/2022: WBC 5.4, hemoglobin 17.3, hematocrit 52.6, MCV 86.4, platelets 198, BUN 29, creatinine 1.25, 10/19/2022: WBC 7.2, hemoglobin 17.7, platelets 190   Test results: JAK-2 (MPN panel): Negative    Patient uses testosterone replacement therapy and this could be the reason for the elevated hemoglobin levels.  The other possible etiology could be related to obstructive sleep apnea and chronic shortness of breath.   Obesity: Patient has lost significant amount of weight on Mounjuro      Plan: Patient has been undergoing phlebotomies at 1 blood every 3 to 4 months.  I have encouraged him to continue with that. Abdominal cyst: Patient is going to undergo cyst aspiration. Return to clinic on an as-needed basis

## 2023-10-19 NOTE — Progress Notes (Signed)
Patient Care Team: Lorenda Ishihara, MD as PCP - General (Internal Medicine) Pricilla Riffle, MD as PCP - Cardiology (Cardiology) Quintella Reichert, MD as PCP - Sleep Medicine (Cardiology)  DIAGNOSIS:  Encounter Diagnoses  Name Primary?   Polycythemia, secondary Yes   Lymphadenopathy     CHIEF COMPLIANT: Follow-up of history of polycythemia and recent CT scan showing enlarged para-aortic lymph node  HISTORY OF PRESENT ILLNESS:  History of Present Illness   Michael Mcgee is a 63 year old male with elevated hemoglobin levels and left para-aortic lymph node enlargement who presents for hematology and oncology consultation. He is accompanied by his wife, Okey Regal. He was referred by his urologist for evaluation of a swollen lymph node and a renal cyst.  A left para-aortic lymph node enlargement has increased from 1.9 cm to 2.4 cm over the past year, as identified on a CT scan performed on September 16, 2023. No other lymphadenopathy is reported.  He has a history of a large abdominal cyst, which was drained last year and found to be non-cancerous. The cyst has partially refilled, now measuring 14 cm, down from 19 cm. He is on blood thinners, which may contribute to the cyst's hemorrhagic nature. There is no evidence of impact on kidney function.  He has a history of elevated hemoglobin levels, managed by donating blood every three months. His hemoglobin was last recorded at 17.1 on October 15, 2023, and he plans to donate blood again in early March. He also has a history of hematuria a year ago, attributed to an enlarged prostate, which is now under control. He has been treated with Levaquin in the past for this issue.  He is currently taking Xarelto for atrial fibrillation, which has been controlled with medication for the past five to six years. No current symptoms of atrial fibrillation.  He uses a CPAP machine for sleep apnea and engages in strength training to maintain physical fitness.  He is a former [place], history, and Nurse, adult. He reports dark urine when dehydrated, possibly linked to previous use of tirzepatide, which suppressed his thirst and appetite. He is currently on Ozempic for diabetes management.         ALLERGIES:  has no known allergies.  MEDICATIONS:  Current Outpatient Medications  Medication Sig Dispense Refill   clonazePAM (KLONOPIN) 2 MG tablet Take 2 mg by mouth at bedtime.     diltiazem (CARDIZEM CD) 360 MG 24 hr capsule TAKE 1 CAPSULE BY MOUTH EVERY DAY 90 capsule 3   gemfibrozil (LOPID) 600 MG tablet Take 600 mg by mouth 2 (two) times daily before a meal.      guaiFENesin 200 MG tablet Take 200 mg by mouth at bedtime.     ibuprofen (ADVIL) 400 MG tablet Take 400 mg by mouth every 6 (six) hours as needed.     levofloxacin (LEVAQUIN) 750 MG tablet Take 750 mg by mouth daily.     losartan (COZAAR) 100 MG tablet Take 1 tablet (100 mg total) daily by mouth. 90 tablet 3   Melatonin 10 MG CAPS Take 10 mg by mouth at bedtime.     rivaroxaban (XARELTO) 20 MG TABS tablet TAKE 1 TABLET BY MOUTH EVERY DAY WITH SUPPER 90 tablet 1   rosuvastatin (CRESTOR) 10 MG tablet Take 1 tablet (10 mg total) by mouth daily. Please make overdue appt with Dr. Tenny Craw before anymore refills. Thank you 2nd attempt 15 tablet 0   testosterone cypionate (DEPOTESTOSTERONE CYPIONATE) 200 MG/ML  injection Inject into the muscle once a week. .75 cc     tirzepatide (MOUNJARO) 12.5 MG/0.5ML Pen Inject 12.5 mg into the skin once a week.     triamterene-hydrochlorothiazide (MAXZIDE-25) 37.5-25 MG tablet TAKE 1 TABLET BY MOUTH EVERY DAY 90 tablet 2   No current facility-administered medications for this visit.    PHYSICAL EXAMINATION: ECOG PERFORMANCE STATUS: 1 - Symptomatic but completely ambulatory  Vitals:   10/19/23 1522  BP: (!) 148/84  Pulse: 91  Resp: 20  Temp: 98.4 F (36.9 C)  SpO2: 96%   Filed Weights   10/19/23 1522  Weight: (!) 306 lb 11.2 oz (139.1 kg)     LABORATORY DATA:  I have reviewed the data as listed    Latest Ref Rng & Units 02/11/2021   11:16 AM 10/31/2018   12:00 AM 08/22/2018   11:38 AM  CMP  Glucose 65 - 99 mg/dL 99  95  98   BUN 8 - 27 mg/dL 23  30  35   Creatinine 0.76 - 1.27 mg/dL 2.54  2.70  6.23   Sodium 134 - 144 mmol/L 143  143  140   Potassium 3.5 - 5.2 mmol/L 4.0  4.5  4.0   Chloride 96 - 106 mmol/L 104  105  103   CO2 20 - 29 mmol/L 23  22  21    Calcium 8.6 - 10.2 mg/dL 9.5  9.0  9.6     Lab Results  Component Value Date   WBC 6.6 11/24/2022   HGB 16.2 11/24/2022   HCT 49.1 11/24/2022   MCV 86.0 11/24/2022   PLT 209 11/24/2022   NEUTROABS 4.4 10/19/2022    ASSESSMENT & PLAN:  Polycythemia, secondary ab review: 09/11/2022: WBC 5.4, hemoglobin 17.3, hematocrit 52.6, MCV 86.4, platelets 198, BUN 29, creatinine 1.25, 10/19/2022: WBC 7.2, hemoglobin 17.7, platelets 190   Test results: JAK-2 (MPN panel): Negative    Patient uses testosterone replacement therapy and this could be the reason for the elevated hemoglobin levels.  The other possible etiology could be related to obstructive sleep apnea and chronic shortness of breath.   Obesity: Patient has lost significant amount of weight on Mounjuro      Plan: Patient has been undergoing phlebotomies at 1 blood every 3 to 4 months.  I have encouraged him to continue with that. Abdominal cyst: Patient is going to undergo cyst aspiration. Return to clinic on an as-needed basis    Lymphadenopathy CT abdomen pelvis January 2025: At urology office: 2.4 cm para-aortic lymph node (it was 1.9 cm in 2024)  Differential diagnosis: Reactive lymph node versus autoimmune versus infection related versus malignancy  I discussed with the patient that it is unlikely to be a lymphoma given the fact that it is a solitary lymph node and even if it was a lymphoma it is extremely indolent in nature and hence these do not need to be treated.  For these reasons I do not  recommend obtaining a biopsy of this.      Paraaortic Lymphadenopathy Mildly progressive paraaortic lymphadenopathy with a single 2.4 cm left paraaortic lymph node, previously 1.9 cm. Differential includes reactive lymphadenopathy, indolent lymphoma, or metastatic disease. Likely benign given slow progression and solitary nature. Low likelihood of lymphoma; if present, likely indolent. - annual CT scan to monitor lymph node size (if urology is doing her scans and we will watch for those results.  If no scan is being ordered then we will request it in 1 year) -  Coordinate with urologist for ongoing monitoring  Hemorrhagic Renal Cyst Recurrent hemorrhagic renal cyst, previously drained but partially refilled to 14 cm. Likely related to anticoagulation therapy with Xarelto. No impact on kidney function. Discussed risks of rupture and need for urologist evaluation for possible removal. -Urology for evaluation and management of renal cyst - Discuss potential impact of anticoagulation therapy with urologist  Atrial Fibrillation Atrial fibrillation, well-controlled with medication. No episodes in past 5-6 years, currently in normal sinus rhythm. Discussed potential discontinuation of anticoagulation therapy if AFib remains controlled. Long-term cardiac monitoring may be necessary. - Refer to cardiologist to evaluate necessity of continued anticoagulation therapy - Consider long-term cardiac monitoring to assess for AFib recurrence  General Health Maintenance Emphasized importance of routine screenings including colonoscopy and PSA to rule out other potential sources of lymphadenopathy. - Ensure up-to-date colonoscopy and PSA screening  Follow-up - Follow up with urologist in April - Schedule follow-up appointment in one year with hematology/oncology      No orders of the defined types were placed in this encounter.  The patient has a good understanding of the overall plan. he agrees with it.  he will call with any problems that may develop before the next visit here. Total time spent: 30 mins including face to face time and time spent for planning, charting and co-ordination of care   Tamsen Meek, MD 10/19/23

## 2024-01-21 ENCOUNTER — Ambulatory Visit: Admitting: Cardiology

## 2024-04-24 NOTE — Progress Notes (Deleted)
  Pt is  no show for appt  

## 2024-04-25 ENCOUNTER — Ambulatory Visit: Admitting: Internal Medicine

## 2024-06-08 NOTE — Progress Notes (Unsigned)
 Cardiology Office Note   Date:  06/09/2024  ID:  Michael Mcgee, DOB [birth year], MRN [id number] PCP: Elliot Charm, MD   [facility] Providers Cardiologist:  Vina Gull, MD Sleep Medicine:  Wilbert Bihari, MD  APP: Orren Fabry, PA-C  History of Present Illness Michael Mcgee is a 63 y.o. male with a past medical history of HTN, HLD, paroxysmal atrial fibrillation and OSA on CPAP here for follow-up appointment.  History includes a Lexiscan  Myoview  2018 showing normal perfusion.  Echocardiogram 2021 with LVEF normal, mild to moderate LVH, septum 13, PW 15.  Patient was seen by Dr. Bihari in 2021.  In 2022 he had a CT calcium  score done and his score was 70.  Exercise stress test was done per patient's request.  Peak BP 209/71.  No ST changes to suggest ischemia.  PVCs in recovery but overall low risk.  Since he was last seen he was very active.  Denies chest pain, SOB, palpitations.  Being evaluated for procedure by interventional radiology since he has no abdominal cyst and presented back then for cardiac clearance.  Today, he presents with a history of atrial fibrillation and left ventricular hypertrophy for cardiovascular evaluation.  Atrial fibrillation was diagnosed seven years ago and is managed with medication. He is on Xarelto  for stroke prevention but paused it recently for blood donation. He monitors his heart rate and EKG with an Apple Watch, consistently showing normal sinus rhythm. Heart rate is usually in the 80s but was 110 during the visit, likely due to 'white coat tachycardia.'  He is concerned about left ventricular hypertrophy, possibly related to long-term weightlifting. He benches up to 300 pounds without dizziness or lightheadedness. He exercises two to three times weekly with full-body workouts. The last echocardiogram was in 2021.  He uses a CPAP machine for sleep apnea and manages polycythemia secondary to testosterone  therapy by donating blood. 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 Recent labs show LDL at 52, triglycerides at 122, and hemoglobin A1c at 6.4.  Reports no shortness of breath nor dyspnea on exertion. Reports no chest pain, pressure, or tightness. No edema, orthopnea, PND. Reports no palpitations.   Discussed the use of AI scribe software for clinical note transcription with the patient, who gave verbal consent to proceed.   ROS: Pertinent ROS in HPI   Studies Reviewed EKG Interpretation Date/Time:  Friday June 09 2024 15:29:51 EDT Ventricular Rate:  107 PR Interval:  200 QRS Duration:  80 QT Interval:  308 QTC Calculation: 411 R Axis:   -63  Text Interpretation: Sinus tachycardia Left axis deviation Minimal voltage criteria for LVH, may be normal variant ( R in aVL ) Inferior infarct , age undetermined When compared with ECG of 30-Apr-2017 10:21, Left anterior fascicular block is no longer Present QT has shortened Confirmed by Fabry Orren [phone]) on 06/09/2024 3:40:56 PM   Echo 01/16/20 IMPRESSIONS     1. Left ventricular ejection fraction, by estimation, is 55 to 60%. The  left ventricle has normal function. The left ventricle has no regional  wall motion abnormalities. There is moderate concentric left ventricular  hypertrophy. Left ventricular  diastolic parameters are consistent with Grade I diastolic dysfunction  (impaired relaxation).   2. Right ventricular systolic function is normal. The right ventricular  size is mildly enlarged. Tricuspid regurgitation signal is inadequate for  assessing PA pressure.   3. The mitral valve is grossly normal. Trivial mitral valve  regurgitation. No evidence of mitral stenosis.  4. The aortic valve is tricuspid. Aortic valve regurgitation is not  visualized. No aortic stenosis is present.   5. The inferior vena cava is normal in size with greater than 50%  respiratory variability, suggesting right atrial pressure of 3 mmHg.    Comparison(s): No significant change from prior study.    Risk Assessment/Calculations  CHA2DS2-VASc Score = 2  This indicates a 2.2% annual risk of stroke. The patient's score is based upon: CHF History: 0 HTN History: 1 Diabetes History: 0 Stroke History: 0 Vascular Disease History: 1 Age Score: 0 Gender Score: 0            Physical Exam VS:  BP 118/79   Pulse (!) 107   Ht 5' 11 (1.803 m)   Wt 300 lb (136.1 kg)   SpO2 93%   BMI 41.84 kg/m        Wt Readings from Last 3 Encounters:  06/09/24 300 lb (136.1 kg)  10/19/23 (!) 306 lb 11.2 oz (139.1 kg)  11/24/22 290 lb (131.5 kg)    GEN: Well nourished, well developed in no acute distress NECK: No JVD; No carotid bruits CARDIAC: Sinus tachycardia, no murmurs, rubs, gallops RESPIRATORY:  Clear to auscultation without rales, wheezing or rhonchi  ABDOMEN: Soft, non-tender, non-distended EXTREMITIES:  No edema; No deformity   ASSESSMENT AND PLAN  Cardiomegaly with left ventricular hypertrophy Long-standing LVH on EKG with no overt symptoms. Concerns about efficient pumping due to weightlifting history. EKG shows LVH, no significant worsening. - Order echocardiogram to assess LVH and ensure stability.  Atrial fibrillation Managed with Xarelto  for stroke prevention. Temporary Xarelto  discontinuation for blood donation without adverse events. - Continue Xarelto  for anticoagulation after blood donation.  Atherosclerotic heart disease of native coronary artery without angina No current angina or significant symptom changes. Regular exercise and heart rate monitoring. LDL and triglycerides acceptable. - Continue current management and lifestyle modifications.  Essential hypertension Blood pressure well-controlled. Occasional sinus tachycardia, possibly due to white coat effect. No medication adjustment needed. - Monitor heart rate, consider low dose beta blocker if heart rate increases.  Morbid obesity Weight loss  plateau despite lifestyle modifications and current Ozempic dose. Discussed potential Ozempic increase to aid weight loss and maintain A1c. - Discuss with primary care provider about increasing Ozempic dosage to 2 mg weekly.  Obstructive sleep apnea Continued CPAP use with no issues. - Continue CPAP therapy.  Hematology-Followed with Dr Odean Gets periodic phlebotomy - He is currently off his Xarelto  to do a blood draw early next week      Dispo: He can follow-up with Dr. Okey in 6 months  Signed, Orren LOISE Fabry, PA-C

## 2024-06-09 ENCOUNTER — Ambulatory Visit: Attending: Physician Assistant | Admitting: Physician Assistant

## 2024-06-09 VITALS — BP 118/79 | HR 107 | Ht 71.0 in | Wt 300.0 lb

## 2024-06-09 DIAGNOSIS — I517 Cardiomegaly: Secondary | ICD-10-CM

## 2024-06-09 DIAGNOSIS — R931 Abnormal findings on diagnostic imaging of heart and coronary circulation: Secondary | ICD-10-CM

## 2024-06-09 DIAGNOSIS — G4733 Obstructive sleep apnea (adult) (pediatric): Secondary | ICD-10-CM | POA: Diagnosis not present

## 2024-06-09 DIAGNOSIS — I4891 Unspecified atrial fibrillation: Secondary | ICD-10-CM

## 2024-06-09 DIAGNOSIS — I10 Essential (primary) hypertension: Secondary | ICD-10-CM | POA: Diagnosis not present

## 2024-06-09 DIAGNOSIS — I2510 Atherosclerotic heart disease of native coronary artery without angina pectoris: Secondary | ICD-10-CM

## 2024-06-09 NOTE — Patient Instructions (Addendum)
 Medication Instructions:   Your physician recommends that you continue on your current medications as directed. Please refer to the Current Medication list given to you today.   *If you need a refill on your cardiac medications before your next appointment, please call your pharmacy*    Lab Work: NONE ORDERED  TODAY    If you have labs (blood work) drawn today and your tests are completely normal, you will receive your results only by: MyChart Message (if you have MyChart) OR A paper copy in the mail If you have any lab test that is abnormal or we need to change your treatment, we will call you to review the results.   Testing/Procedures: IN  6 MONTHS Your physician has requested that you have an echocardiogram. Echocardiography is a painless test that uses sound waves to create images of your heart. It provides your doctor with information about the size and shape of your heart and how well your heart's chambers and valves are working. This procedure takes approximately one hour. There are no restrictions for this procedure. Please do NOT wear cologne, perfume, aftershave, or lotions (deodorant is allowed). Please arrive 15 minutes prior to your appointment time.  Please note: We ask at that you not bring children with you during ultrasound (echo/ vascular) testing. Due to room size and safety concerns, children are not allowed in the ultrasound rooms during exams. Our front office staff cannot provide observation of children in our lobby area while testing is being conducted. An adult accompanying a patient to their appointment will only be allowed in the ultrasound room at the discretion of the ultrasound technician under special circumstances. We apologize for any inconvenience.    Follow-Up:  At [facility], you and your health needs are our priority.  As part of our continuing mission to provide you with exceptional heart care, our providers are all part of one team.   This team includes your primary Cardiologist (physician) and Advanced Practice Providers or APPs (Physician Assistants and Nurse Practitioners) who all work together to provide you with the care you need, when you need it.  Your next appointment:   6 month(s)   Provider:   Vina Gull, MD    We recommend signing up for the patient portal called MyChart.  Sign up information is provided on this After Visit Summary.  MyChart is used to connect with patients for Virtual Visits (Telemedicine).  Patients are able to view lab/test results, encounter notes, upcoming appointments, etc.  Non-urgent messages can be sent to your provider as well.   To learn more about what you can do with MyChart, go to [email or web address].   Other Instructions     We recommend that you decrease your intake of salt.  1. Use Morton's Salt substitute.   2. Use Ms. Dash   3. Mccormick;s salt free seasoning   4. Avoid processed meat - bacon ( including [place] bacon) , sausage, ham, hot dogs, Spam  5. Avoid [place] soup 6. Avoid prepared meals 7. Eat fresh or frozen foods that specifically do not have any added salt.   Heart-Healthy Eating Plan Eating a healthy diet is important for the health of your heart. A heart-healthy eating plan includes: Eating less unhealthy fats. Eating more healthy fats. Eating less salt in your food. Salt is also called sodium. Making other changes in your diet. Talk with your doctor or a diet specialist (dietitian) to create an eating plan that is right for you. What is  my plan? Your doctor may recommend an eating plan that includes: Total fat: ______% or less of total calories a day. Saturated fat: ______% or less of total calories a day. Cholesterol: less than _________mg a day. Sodium: less than _________mg a day. What are tips for following this plan? Cooking Avoid frying your food. Try to bake, boil, grill, or broil it instead. You can also reduce fat  by: Removing the skin from poultry. Removing all visible fats from meats. Steaming vegetables in water or broth. Meal planning  At meals, divide your plate into four equal parts: Fill one-half of your plate with vegetables and green salads. Fill one-fourth of your plate with whole grains. Fill one-fourth of your plate with lean protein foods. Eat 2-4 cups of vegetables per day. One cup of vegetables is: 1 cup (91 g) broccoli or cauliflower florets. 2 medium carrots. 1 large bell pepper. 1 large sweet potato. 1 large tomato. 1 medium white potato. 2 cups (150 g) raw leafy greens. Eat 1-2 cups of fruit per day. One cup of fruit is: 1 small apple 1 large banana 1 cup (237 g) mixed fruit, 1 large orange,  cup (82 g) dried fruit, 1 cup (240 mL) 100% fruit juice. Eat more foods that have soluble fiber. These are apples, broccoli, carrots, beans, peas, and barley. Try to get 20-30 g of fiber per day. Eat 4-5 servings of nuts, legumes, and seeds per week: 1 serving of dried beans or legumes equals  cup (90 g) cooked. 1 serving of nuts is  oz (12 almonds, 24 pistachios, or 7 walnut halves). 1 serving of seeds equals  oz (8 g). General information Eat more home-cooked food. Eat less restaurant, buffet, and fast food. Limit or avoid alcohol. Limit foods that are high in starch and sugar. Avoid fried foods. Lose weight if you are overweight. Keep track of how much salt (sodium) you eat. This is important if you have high blood pressure. Ask your doctor to tell you more about this. Try to add vegetarian meals each week. Fats Choose healthy fats. These include olive oil and canola oil, flaxseeds, walnuts, almonds, and seeds. Eat more omega-3 fats. These include salmon, mackerel, sardines, tuna, flaxseed oil, and ground flaxseeds. Try to eat fish at least 2 times each week. Check food labels. Avoid foods with trans fats or high amounts of saturated fat. Limit saturated fats. These  are often found in animal products, such as meats, butter, and cream. These are also found in plant foods, such as palm oil, palm kernel oil, and coconut oil. Avoid foods with partially hydrogenated oils in them. These have trans fats. Examples are stick margarine, some tub margarines, cookies, crackers, and other baked goods. What foods should I eat? Fruits All fresh, canned (in natural juice), or frozen fruits. Vegetables Fresh or frozen vegetables (raw, steamed, roasted, or grilled). Green salads. Grains Most grains. Choose whole wheat and whole grains most of the time. Rice and pasta, including brown rice and pastas made with whole wheat. Meats and other proteins Lean, well-trimmed beef, veal, pork, and lamb. Chicken and [place] without skin. All fish and shellfish. Wild duck, rabbit, pheasant, and venison. Egg whites or low-cholesterol egg substitutes. Dried beans, peas, lentils, and tofu. Seeds and most nuts. Dairy Low-fat or nonfat cheeses, including ricotta and mozzarella. Skim or 1% milk that is liquid, powdered, or evaporated. Buttermilk that is made with low-fat milk. Nonfat or low-fat yogurt. Fats and oils Non-hydrogenated (trans-free) margarines. Vegetable oils, including  soybean, sesame, sunflower, olive, peanut, safflower, corn, canola, and cottonseed. Salad dressings or mayonnaise made with a vegetable oil. Beverages Mineral water. Coffee and tea. Diet carbonated beverages. Sweets and desserts Sherbet, gelatin, and fruit ice. Small amounts of dark chocolate. Limit all sweets and desserts. Seasonings and condiments All seasonings and condiments. The items listed above may not be a complete list of foods and drinks you can eat. Contact a dietitian for more options. What foods should I avoid? Fruits Canned fruit in heavy syrup. Fruit in cream or butter sauce. Fried fruit. Limit coconut. Vegetables Vegetables cooked in cheese, cream, or butter sauce. Fried  vegetables. Grains Breads that are made with saturated or trans fats, oils, or whole milk. Croissants. Sweet rolls. Donuts. High-fat crackers, such as cheese crackers. Meats and other proteins Fatty meats, such as hot dogs, ribs, sausage, bacon, rib-eye roast or steak. High-fat deli meats, such as salami and bologna. Caviar. Domestic duck and goose. Organ meats, such as liver. Dairy Cream, sour cream, cream cheese, and creamed cottage cheese. Whole-milk cheeses. Whole or 2% milk that is liquid, evaporated, or condensed. Whole buttermilk. Cream sauce or high-fat cheese sauce. Yogurt that is made from whole milk. Fats and oils Meat fat, or shortening. Cocoa butter, hydrogenated oils, palm oil, coconut oil, palm kernel oil. Solid fats and shortenings, including bacon fat, salt pork, lard, and butter. Nondairy cream substitutes. Salad dressings with cheese or sour cream. Beverages Regular sodas and juice drinks with added sugar. Sweets and desserts Frosting. Pudding. Cookies. Cakes. Pies. Milk chocolate or white chocolate. Buttered syrups. Full-fat ice cream or ice cream drinks. The items listed above may not be a complete list of foods and drinks to avoid. Contact a dietitian for more information. Summary Heart-healthy meal planning includes eating less unhealthy fats, eating more healthy fats, and making other changes in your diet. Eat a balanced diet. This includes fruits and vegetables, low-fat or nonfat dairy, lean protein, nuts and legumes, whole grains, and heart-healthy oils and fats. This information is not intended to replace advice given to you by your health care provider. Make sure you discuss any questions you have with your health care provider. Document Revised: 09/29/2021 Document Reviewed: 09/29/2021 Elsevier Patient Education  2024 [employer].

## 2024-07-11 ENCOUNTER — Ambulatory Visit (HOSPITAL_COMMUNITY)
Admission: RE | Admit: 2024-07-11 | Discharge: 2024-07-11 | Disposition: A | Discharge status: Home / Self Care | Source: Ambulatory Visit | Attending: Physician Assistant | Admitting: Physician Assistant

## 2024-07-11 DIAGNOSIS — R600 Localized edema: Secondary | ICD-10-CM | POA: Diagnosis not present

## 2024-07-11 DIAGNOSIS — I517 Cardiomegaly: Secondary | ICD-10-CM | POA: Insufficient documentation

## 2024-07-11 LAB — ECHOCARDIOGRAM COMPLETE
AR max vel: 3.74 cm2
AV Area VTI: 3.74 cm2
AV Area mean vel: 3.5 cm2
AV Mean grad: 6 mmHg
AV Peak grad: 11.8 mmHg
Ao pk vel: 1.72 m/s
Area-P 1/2: 2.6 cm2
S' Lateral: 2.7 cm

## 2024-07-19 ENCOUNTER — Ambulatory Visit: Payer: Self-pay | Admitting: Physician Assistant

## 2024-10-19 ENCOUNTER — Inpatient Hospital Stay: Payer: No Typology Code available for payment source | Admitting: Hematology and Oncology
